# Patient Record
Sex: Male | Born: 1985 | Race: Black or African American | Hispanic: No | Marital: Single | Smoking: Current every day smoker
Health system: Southern US, Community
[De-identification: ages and names within clinical notes are randomized; demographics above are authoritative.]

## PROBLEM LIST (undated history)

## (undated) DIAGNOSIS — W3400XA Accidental discharge from unspecified firearms or gun, initial encounter: Secondary | ICD-10-CM

## (undated) DIAGNOSIS — A599 Trichomoniasis, unspecified: Secondary | ICD-10-CM

## (undated) HISTORY — PX: EYE SURGERY: SHX253

## (undated) HISTORY — PX: TONSILLECTOMY: SUR1361

## (undated) HISTORY — PX: OTHER SURGICAL HISTORY: SHX169

---

## 2013-06-29 ENCOUNTER — Emergency Department (HOSPITAL_BASED_OUTPATIENT_CLINIC_OR_DEPARTMENT_OTHER)
Admission: EM | Admit: 2013-06-29 | Discharge: 2013-06-29 | Disposition: A | Discharge status: Home / Self Care | Payer: Self-pay | Attending: Emergency Medicine | Admitting: Emergency Medicine

## 2013-06-29 ENCOUNTER — Encounter (HOSPITAL_BASED_OUTPATIENT_CLINIC_OR_DEPARTMENT_OTHER): Payer: Self-pay | Admitting: *Deleted

## 2013-06-29 DIAGNOSIS — L739 Follicular disorder, unspecified: Secondary | ICD-10-CM

## 2013-06-29 DIAGNOSIS — Z872 Personal history of diseases of the skin and subcutaneous tissue: Secondary | ICD-10-CM | POA: Insufficient documentation

## 2013-06-29 DIAGNOSIS — L738 Other specified follicular disorders: Secondary | ICD-10-CM | POA: Insufficient documentation

## 2013-06-29 MED ORDER — SULFAMETHOXAZOLE-TMP DS 800-160 MG PO TABS
1.0000 | ORAL_TABLET | Freq: Once | ORAL | Status: AC
  Administered 2013-06-29: 1 via ORAL
  Filled 2013-06-29: qty 1

## 2013-06-29 MED ORDER — SULFAMETHOXAZOLE-TRIMETHOPRIM 800-160 MG PO TABS: 1.0000 | ORAL_TABLET | Freq: Two times a day (BID) | ORAL | Status: DC

## 2013-06-29 NOTE — ED Notes (Signed)
PT reports open sores/knots to top of head x 1 week. Denies pain. Significant other reports pt's lymph nodes seemed swollen. Small red sores noted in between dreads. Pt has been scratching sores.

## 2013-06-29 NOTE — ED Provider Notes (Signed)
CSN: [id number]     Arrival date & time 06/29/13  1026 History   First MD Initiated Contact with Patient 06/29/13 1045     Chief Complaint  Patient presents with  . Abrasion   (Consider location/radiation/quality/duration/timing/severity/associated sxs/prior Treatment) HPI Patient presents with concerns of multiple cutaneous lesions.  Symptoms began approximately one week ago.  Since onset there been multiple areas on his scalp, and one on his chest.  Lesions are itchy, not painful.  There is no concurrent fevers, chills, nausea, vomiting, confusion, disorientation, chest pain, dyspnea or any other focal complaints. Patient states that he has a history of impetigo in the distant past. Patient is generally well. Patient has 8 children in his household.  History reviewed. No pertinent past medical history. Past Surgical History  Procedure Laterality Date  . Tonsillectomy    . Arm surgery     No family history on file. History  Substance Use Topics  . Smoking status: Never Smoker   . Smokeless tobacco: Not on file  . Alcohol Use: No    Review of Systems  All other systems reviewed and are negative.    Allergies  Review of patient's allergies indicates no known allergies.  Home Medications   Current Outpatient Rx  Name  Route  Sig  Dispense  Refill  . sulfamethoxazole-trimethoprim (SEPTRA DS) 800-160 MG per tablet   Oral   Take 1 tablet by mouth every 12 (twelve) hours.   10 tablet   0    BP 141/95  Pulse 81  Temp(Src) 98.4 F (36.9 C) (Oral)  Resp 20  Ht 6\' 2"  (1.88 m)  Wt 328 lb (148.78 kg)  BMI 42.09 kg/m2  SpO2 98% Physical Exam  Nursing note and vitals reviewed. Constitutional: He is oriented to person, place, and time. He appears well-developed. No distress.  HENT:  Head: Normocephalic and atraumatic.  Eyes: Conjunctivae and EOM are normal.  Pulmonary/Chest: Effort normal. No stridor. No respiratory distress.  Musculoskeletal: He exhibits no edema.   Lymphadenopathy:       Head (right side): Occipital adenopathy present.       Head (left side): Occipital adenopathy present.  Neurological: He is alert and oriented to person, place, and time.  Skin: Skin is warm and dry.  Scattered about the scalp, and with one lesion on the chest there are multiple lesions each of which is approximately 1 cm in diameter with a central pale area, mild serous exudate, trace surrounding erythema.  No discharge, no bleeding.  Psychiatric: He has a normal mood and affect.    ED Course  Procedures (including critical care time) Labs Review Labs Reviewed - No data to display Imaging Review No results found.  MDM   1. Folliculitis    This generally well male presents with new cutaneous lesions, and no fever, chills, other evidence of systemic infection.  Though this may represent a yeast infection, the distribution of lesions is more consistent with bacterial infection.  Patient started on antibiotics, and after a lengthy discussion on return precautions, follow up instructions he was discharged in stable condition.    Gerhard Munch, MD 06/29/13 1128

## 2014-04-22 ENCOUNTER — Emergency Department (HOSPITAL_BASED_OUTPATIENT_CLINIC_OR_DEPARTMENT_OTHER)
Admission: EM | Admit: 2014-04-22 | Discharge: 2014-04-22 | Disposition: A | Discharge status: Home / Self Care | Payer: Self-pay | Attending: Emergency Medicine | Admitting: Emergency Medicine

## 2014-04-22 ENCOUNTER — Encounter (HOSPITAL_BASED_OUTPATIENT_CLINIC_OR_DEPARTMENT_OTHER): Payer: Self-pay | Admitting: Emergency Medicine

## 2014-04-22 DIAGNOSIS — F17200 Nicotine dependence, unspecified, uncomplicated: Secondary | ICD-10-CM | POA: Insufficient documentation

## 2014-04-22 DIAGNOSIS — Z79899 Other long term (current) drug therapy: Secondary | ICD-10-CM | POA: Insufficient documentation

## 2014-04-22 DIAGNOSIS — Z792 Long term (current) use of antibiotics: Secondary | ICD-10-CM | POA: Insufficient documentation

## 2014-04-22 DIAGNOSIS — A64 Unspecified sexually transmitted disease: Secondary | ICD-10-CM | POA: Insufficient documentation

## 2014-04-22 HISTORY — DX: Trichomoniasis, unspecified: A59.9

## 2014-04-22 MED ORDER — METRONIDAZOLE 500 MG PO TABS: 500.0000 mg | ORAL_TABLET | Freq: Three times a day (TID) | ORAL | Status: DC

## 2014-04-22 MED ORDER — CEFTRIAXONE SODIUM 250 MG IJ SOLR
250.0000 mg | Freq: Once | INTRAMUSCULAR | Status: AC
  Administered 2014-04-22: 250 mg via INTRAMUSCULAR
  Filled 2014-04-22: qty 250

## 2014-04-22 MED ORDER — AZITHROMYCIN 1 G PO PACK
1.0000 g | PACK | Freq: Once | ORAL | Status: AC
  Administered 2014-04-22: 1 g via ORAL
  Filled 2014-04-22: qty 1

## 2014-04-22 NOTE — ED Provider Notes (Addendum)
CSN: [id number]     Arrival date & time 04/22/14  0248 History   First MD Initiated Contact with Patient 04/22/14 [phone]     Chief Complaint  Patient presents with  . Exposure to STD     (Consider location/radiation/quality/duration/timing/severity/associated sxs/prior Treatment) Patient is a 28 y.o. male presenting with STD exposure. The history is provided by the patient.  Exposure to STD This is a new problem. The current episode started yesterday (girlfriend told him yesterday she had trichomonas.  ). The problem occurs constantly. The problem has not changed since onset.Pertinent negatives include no chest pain, no abdominal pain, no headaches and no shortness of breath. Nothing aggravates the symptoms. Nothing relieves the symptoms. He has tried nothing for the symptoms. The treatment provided no relief.    Past Medical History  Diagnosis Date  . Trichimoniasis    Past Surgical History  Procedure Laterality Date  . Tonsillectomy    . Arm surgery    . Eye surgery     History reviewed. No pertinent family history. History  Substance Use Topics  . Smoking status: Current Every Day Smoker -- 1.00 packs/day    Types: Cigarettes  . Smokeless tobacco: Not on file  . Alcohol Use: 1.2 oz/week    2 Cans of beer per week     Comment: daily    Review of Systems  Respiratory: Negative for shortness of breath.   Cardiovascular: Negative for chest pain.  Gastrointestinal: Negative for abdominal pain.  Genitourinary: Negative for dysuria, discharge, penile swelling, scrotal swelling and penile pain.  Neurological: Negative for headaches.  All other systems reviewed and are negative.     Allergies  Review of patient's allergies indicates no known allergies.  Home Medications   Prior to Admission medications   Medication Sig Start Date End Date Taking? Authorizing Provider  metroNIDAZOLE (FLAGYL) 500 MG tablet Take 1 tablet (500 mg total) by mouth 3 (three) times daily. 2  grams PO x 1 04/22/14   Meaghen Vecchiarelli K Zala Degrasse-Rasch, MD  sulfamethoxazole-trimethoprim (SEPTRA DS) 800-160 MG per tablet Take 1 tablet by mouth every 12 (twelve) hours. 06/29/13   Gerhard Munchobert Lockwood, MD   BP 174/97  Pulse 91  Temp(Src) 98 F (36.7 C) (Oral)  Resp 20  Ht 6\' 2"  (1.88 m)  Wt 340 lb (154.223 kg)  BMI 43.63 kg/m2  SpO2 96% Physical Exam  Constitutional: He is oriented to person, place, and time. He appears well-developed and well-nourished.  HENT:  Head: Normocephalic and atraumatic.  Mouth/Throat: Oropharynx is clear and moist.  Eyes: Conjunctivae are normal. Pupils are equal, round, and reactive to light.  Neck: Normal range of motion. Neck supple.  Cardiovascular: Normal rate, regular rhythm and intact distal pulses.   Pulmonary/Chest: Effort normal and breath sounds normal. He has no wheezes. He has no rales.  Abdominal: Soft. Bowel sounds are normal. There is no tenderness. There is no rebound and no guarding.  Genitourinary:  Patient refused  Musculoskeletal: Normal range of motion.  Neurological: He is alert and oriented to person, place, and time.  Skin: Skin is warm and dry.  Psychiatric: He has a normal mood and affect.    ED Course  Procedures (including critical care time) Labs Review Labs Reviewed  GC/CHLAMYDIA PROBE AMP    Imaging Review No results found.   EKG Interpretation None      MDM   Final diagnoses:  STI (sexually transmitted infection)    Refused penile swab seems reasonable to treat given history.  No sex for seven days.  Given RX for flagyl because patient recently drank alcohol and do not want to cause disulfram reaction     Dakiya Puopolo K Kaiyu Mirabal-Rasch, MD 04/22/14 0810  Rachella Basden K Alsie Younes-Rasch, MD 04/22/14 [phone]

## 2014-04-22 NOTE — Discharge Instructions (Signed)
Sexually Transmitted Disease A sexually transmitted disease (STD) is a disease or infection often passed to another person during sex. However, STDs can be passed through nonsexual ways. An STD can be passed through:  Spit (saliva).  Semen.  Blood.  Mucus from the vagina.  Pee (urine). HOW CAN I LESSEN MY CHANCES OF GETTING AN STD?  Use:  Latex condoms.  Water-soluble lubricants with condoms. Do not use petroleum jelly or oils.  Dental dams. These are small pieces of latex that are used as a barrier during oral sex.  Avoid having more than one sex partner.  Do not have sex with someone who has other sex partners.  Do not have sex with anyone you do not know or who is at high risk for an STD.  Avoid risky sex that can break your skin.  Do not have sex if you have open sores on your mouth or skin.  Avoid drinking too much alcohol or taking illegal drugs. Alcohol and drugs can affect your good judgment.  Avoid oral and anal sex acts.  Get shots (vaccines) for HPV and hepatitis.  If you are at risk of being infected with HIV, it is advised that you take a certain medicine daily to prevent HIV infection. This is called pre-exposure prophylaxis (PrEP). You may be at risk if:  You are a man who has sex with other men (MSM).  You are attracted to the opposite sex (heterosexual) and are having sex with more than one partner.  You take drugs with a needle.  You have sex with someone who has HIV.  Talk with your doctor about if you are at high risk of being infected with HIV. If you begin to take PrEP, get tested for HIV first. Get tested every 3 months for as long as you are taking PrEP. WHAT SHOULD I DO IF I THINK I HAVE AN STD?  See your doctor.  Tell your sex partner(s) that you have an STD. They should be tested and treated.  Do not have sex until your doctor says it is okay. WHEN SHOULD I GET HELP? Get help right away if:  You have bad belly (abdominal)  pain.  You are a man and have puffiness (swelling) or pain in your testicles.  You are a woman and have puffiness in your vagina. Document Released: 11/18/2004 Document Revised: 10/16/2013 Document Reviewed: 04/06/2013 [employer] Patient Information 2015 [employer]. This information is not intended to replace advice given to you by your health care provider. Make sure you discuss any questions you have with your health care provider.  

## 2014-04-22 NOTE — ED Notes (Signed)
Pt reports that his sexual partner went to [facility] and was told she has trich now he wants to get tested.  Denies any penile discharge, burning with urination or sores.

## 2014-11-12 ENCOUNTER — Emergency Department (HOSPITAL_BASED_OUTPATIENT_CLINIC_OR_DEPARTMENT_OTHER)
Admission: EM | Admit: 2014-11-12 | Discharge: 2014-11-12 | Disposition: A | Discharge status: Home / Self Care | Payer: Self-pay | Attending: Emergency Medicine | Admitting: Emergency Medicine

## 2014-11-12 ENCOUNTER — Encounter (HOSPITAL_BASED_OUTPATIENT_CLINIC_OR_DEPARTMENT_OTHER): Payer: Self-pay | Admitting: *Deleted

## 2014-11-12 DIAGNOSIS — Z791 Long term (current) use of non-steroidal anti-inflammatories (NSAID): Secondary | ICD-10-CM | POA: Insufficient documentation

## 2014-11-12 DIAGNOSIS — Z792 Long term (current) use of antibiotics: Secondary | ICD-10-CM | POA: Insufficient documentation

## 2014-11-12 DIAGNOSIS — Z8619 Personal history of other infectious and parasitic diseases: Secondary | ICD-10-CM | POA: Insufficient documentation

## 2014-11-12 DIAGNOSIS — L0231 Cutaneous abscess of buttock: Secondary | ICD-10-CM | POA: Insufficient documentation

## 2014-11-12 DIAGNOSIS — L0291 Cutaneous abscess, unspecified: Secondary | ICD-10-CM

## 2014-11-12 MED ORDER — SULFAMETHOXAZOLE-TRIMETHOPRIM 800-160 MG PO TABS
1.0000 | ORAL_TABLET | Freq: Two times a day (BID) | ORAL | Status: DC
Start: 2014-11-12 — End: 2015-03-03

## 2014-11-12 MED ORDER — TRAMADOL HCL 50 MG PO TABS: 50.0000 mg | ORAL_TABLET | Freq: Four times a day (QID) | ORAL | Status: DC | PRN

## 2014-11-12 MED ORDER — MIDAZOLAM HCL 2 MG/2ML IJ SOLN
2.0000 mg | Freq: Once | INTRAMUSCULAR | Status: AC
Start: 2014-11-12 — End: 2014-11-12
  Administered 2014-11-12: 2 mg via INTRAVENOUS

## 2014-11-12 MED ORDER — FENTANYL CITRATE 0.05 MG/ML IJ SOLN
50.0000 ug | Freq: Once | INTRAMUSCULAR | Status: AC
  Administered 2014-11-12: 50 ug via INTRAVENOUS

## 2014-11-12 MED ORDER — LIDOCAINE-EPINEPHRINE (PF) 2 %-1:200000 IJ SOLN
INTRAMUSCULAR | Status: AC
  Administered 2014-11-12: 10 mL
  Filled 2014-11-12: qty 20

## 2014-11-12 MED ORDER — FENTANYL CITRATE 0.05 MG/ML IJ SOLN
INTRAMUSCULAR | Status: AC
  Filled 2014-11-12: qty 2

## 2014-11-12 MED ORDER — HYDROMORPHONE HCL 1 MG/ML IJ SOLN
0.5000 mg | Freq: Once | INTRAMUSCULAR | Status: AC
  Administered 2014-11-12: 0.5 mg via INTRAVENOUS
  Filled 2014-11-12: qty 1

## 2014-11-12 MED ORDER — NAPROXEN 500 MG PO TABS: 500.0000 mg | ORAL_TABLET | Freq: Two times a day (BID) | ORAL | Status: DC

## 2014-11-12 MED ORDER — PROPOFOL 10 MG/ML IV BOLUS
200.0000 mg | Freq: Once | INTRAVENOUS | Status: AC
  Administered 2014-11-12: 50 mg via INTRAVENOUS
  Filled 2014-11-12: qty 20

## 2014-11-12 MED ORDER — MIDAZOLAM HCL 2 MG/2ML IJ SOLN
INTRAMUSCULAR | Status: AC
  Filled 2014-11-12: qty 4

## 2014-11-12 MED ORDER — SULFAMETHOXAZOLE-TRIMETHOPRIM 800-160 MG PO TABS
1.0000 | ORAL_TABLET | Freq: Once | ORAL | Status: AC
  Administered 2014-11-12: 1 via ORAL
  Filled 2014-11-12: qty 1

## 2014-11-12 MED ORDER — PROPOFOL 10 MG/ML IV BOLUS
INTRAVENOUS | Status: AC | PRN
  Administered 2014-11-12: 50 mg via INTRAVENOUS

## 2014-11-12 NOTE — ED Notes (Signed)
No answer when called to triage.

## 2014-11-12 NOTE — Sedation Documentation (Signed)
Dr. Hyacinth MeekerMiller back in to see Pt. And speak with pt. Who is awake and conversing about his post care.  Pt. Aware of how to care for the area of concern.

## 2014-11-12 NOTE — Sedation Documentation (Signed)
Kayla Love EMT in room at 1405 during procedure with Pt. At bedside to assist with Dr. Hyacinth MeekerMiller.    Herbert DeanerKayla Love out at [phone]

## 2014-11-12 NOTE — ED Notes (Signed)
States he has a cyst between his scrotum and his buttocks. He states it has been there for years but it just opened and draining for the past few hours.

## 2014-11-12 NOTE — ED Provider Notes (Signed)
CSN: [id number]     Arrival date & time 11/12/14  1249 History   First MD Initiated Contact with Patient 11/12/14 1300     Chief Complaint  Patient presents with  . Abscess     (Consider location/radiation/quality/duration/timing/severity/associated sxs/prior Treatment) HPI Comments: The patient is a 29 year old male, he has a history of obesity, history of recurrent infections such as abscesses in the past, presents with several days of worsening pain to his perineum which is gradually worsening, worse with palpation and is now spontaneously draining purulent material. He denies fevers chills nausea vomiting or abdominal pain. The symptoms are persistent  Patient is a 29 y.o. male presenting with abscess. The history is provided by the patient.  Abscess Associated symptoms: no fever, no nausea and no vomiting     Past Medical History  Diagnosis Date  . Trichimoniasis    Past Surgical History  Procedure Laterality Date  . Tonsillectomy    . Arm surgery    . Eye surgery     No family history on file. History  Substance Use Topics  . Smoking status: Current Every Day Smoker -- 1.00 packs/day    Types: Cigarettes  . Smokeless tobacco: Not on file  . Alcohol Use: 1.2 oz/week    2 Cans of beer per week     Comment: daily    Review of Systems  Constitutional: Negative for fever and chills.  Gastrointestinal: Negative for nausea and vomiting.  Skin: Positive for rash.       abscess      Allergies  Review of patient's allergies indicates no known allergies.  Home Medications   Prior to Admission medications   Medication Sig Start Date End Date Taking? Authorizing Provider  metroNIDAZOLE (FLAGYL) 500 MG tablet Take 1 tablet (500 mg total) by mouth 3 (three) times daily. 2 grams PO x 1 04/22/14   April K Palumbo-Rasch, MD  naproxen (NAPROSYN) 500 MG tablet Take 1 tablet (500 mg total) by mouth 2 (two) times daily with a meal. 11/12/14   Vida RollerBrian D Delwin Raczkowski, MD   sulfamethoxazole-trimethoprim (SEPTRA DS) 800-160 MG per tablet Take 1 tablet by mouth every 12 (twelve) hours. 11/12/14   Vida RollerBrian D Tattiana Fakhouri, MD  traMADol (ULTRAM) 50 MG tablet Take 1 tablet (50 mg total) by mouth every 6 (six) hours as needed. 11/12/14   Vida RollerBrian D Trypp Heckmann, MD   BP 121/73 mmHg  Pulse 100  Temp(Src) 98.8 F (37.1 C) (Oral)  Resp 20  Ht 6\' 2"  (1.88 m)  Wt 345 lb (156.491 kg)  BMI 44.28 kg/m2  SpO2 99% Physical Exam  Constitutional: He appears well-developed and well-nourished. No distress.  HENT:  Head: Normocephalic and atraumatic.  Eyes: Conjunctivae are normal. Right eye exhibits no discharge. Left eye exhibits no discharge. No scleral icterus.  Cardiovascular: Normal rate and regular rhythm.   No murmur heard. Pulmonary/Chest: Effort normal and breath sounds normal.  Genitourinary:  Perineum has a abscess mostly on the left inner buttock and perianal area, spontaneously draining purulent foul-smelling bloody material  Musculoskeletal: He exhibits tenderness. He exhibits no edema.  Skin: Skin is warm and dry. He is not diaphoretic.  Nursing note and vitals reviewed.   ED Course  Procedures (including critical care time) Labs Review Labs Reviewed - No data to display  Imaging Review No results found.     MDM   Final diagnoses:  Abscess    Abscess present, we'll need to investigate further with bedside exam, bedside ultrasound, pain medications  ordered. The location and size of this infection suggest the need for antibiotics, it is draining spontaneously but may need additional incision for complete spontaneous drainage.  Procedural sedation Performed by: Vida Roller Consent: Verbal consent obtained. Risks and benefits: risks, benefits and alternatives were discussed Required items: required blood products, implants, devices, and special equipment available Patient identity confirmed: arm band and provided demographic data Time out: Immediately prior  to procedure a "time out" was called to verify the correct patient, procedure, equipment, support staff and site/side marked as required.  Sedation type: moderate (conscious) sedation NPO time confirmed and considedered  Sedatives: PROPOFOL, Versed, Fentanyl  Physician Time at Bedside: 20 minutes   Vitals: Vital signs were monitored during sedation. Cardiac Monitor, pulse oximeter Patient tolerance: Patient tolerated the procedure well with no immediate complications. Comments: Pt with uneventful recovered. Returned to pre-procedural sedation baseline  INCISION AND DRAINAGE Performed by: Eber Hong D Consent: Verbal consent obtained. Risks and benefits: risks, benefits and alternatives were discussed Type: abscess  Body area: L perineum  Anesthesia: local infiltration  Incision was made with a scalpel.  Local anesthetic: lidocaine 2% with epinephrine  Anesthetic total: 3 ml  Complexity: complex Blunt dissection to break up loculations  Drainage: purulent  Drainage amount: moderate   Packing material:  Patient tolerance: Patient tolerated the procedure well with no immediate complications. There was already a large draining sinus = Materials inside of it. I had to probe around with my finger, I inserted my finger into the abscess cavity to break up loculations, I was able to completely clean out and then irrigate with saline until clean. The opening of the sinus was too large to hold packing, there was no other abscess seen on bedside ultrasound by myself, clinically there is some surrounding cellulitis which will require antibiotics however the patient appears stable and amenable to discharge.  Meds given in ED:  Medications  lidocaine-EPINEPHrine (XYLOCAINE W/EPI) 2 %-1:200000 (PF) injection (not administered)  HYDROmorphone (DILAUDID) injection 0.5 mg (0.5 mg Intravenous Given 11/12/14 1341)  propofol (DIPRIVAN) 10 mg/mL bolus/IV push 200 mg (0 mg Intravenous Stopped  11/12/14 1437)  midazolam (VERSED) injection 2 mg (2 mg Intravenous Given 11/12/14 1428)  fentaNYL (SUBLIMAZE) injection 50 mcg (50 mcg Intravenous Given 11/12/14 1427)    New Prescriptions   NAPROXEN (NAPROSYN) 500 MG TABLET    Take 1 tablet (500 mg total) by mouth 2 (two) times daily with a meal.   SULFAMETHOXAZOLE-TRIMETHOPRIM (SEPTRA DS) 800-160 MG PER TABLET    Take 1 tablet by mouth every 12 (twelve) hours.   TRAMADOL (ULTRAM) 50 MG TABLET    Take 1 tablet (50 mg total) by mouth every 6 (six) hours as needed.    was    Vida Roller, MD 11/12/14 [phone]

## 2014-11-12 NOTE — Sedation Documentation (Signed)
Pt. Still feeling pain after meds given  Pt. Has had 100mg  of diprivanas of 1420

## 2014-11-12 NOTE — Discharge Instructions (Signed)

## 2014-11-14 ENCOUNTER — Emergency Department (HOSPITAL_BASED_OUTPATIENT_CLINIC_OR_DEPARTMENT_OTHER)
Admission: EM | Admit: 2014-11-14 | Discharge: 2014-11-14 | Disposition: A | Discharge status: Home / Self Care | Payer: Self-pay | Attending: Emergency Medicine | Admitting: Emergency Medicine

## 2014-11-14 ENCOUNTER — Encounter (HOSPITAL_BASED_OUTPATIENT_CLINIC_OR_DEPARTMENT_OTHER): Payer: Self-pay | Admitting: *Deleted

## 2014-11-14 DIAGNOSIS — Z8619 Personal history of other infectious and parasitic diseases: Secondary | ICD-10-CM | POA: Insufficient documentation

## 2014-11-14 DIAGNOSIS — Z79899 Other long term (current) drug therapy: Secondary | ICD-10-CM | POA: Insufficient documentation

## 2014-11-14 DIAGNOSIS — Z720 Tobacco use: Secondary | ICD-10-CM | POA: Insufficient documentation

## 2014-11-14 DIAGNOSIS — Z5189 Encounter for other specified aftercare: Secondary | ICD-10-CM

## 2014-11-14 DIAGNOSIS — Z791 Long term (current) use of non-steroidal anti-inflammatories (NSAID): Secondary | ICD-10-CM | POA: Insufficient documentation

## 2014-11-14 DIAGNOSIS — Z4801 Encounter for change or removal of surgical wound dressing: Secondary | ICD-10-CM | POA: Insufficient documentation

## 2014-11-14 NOTE — Discharge Instructions (Signed)
Return to the emergency room with worsening of symptoms, new symptoms or with symptoms that are concerning, especially fevers, increased redness, swelling, pain, red streaks, or pus from the area, generally feeling ill. Please take all of your antibiotics until finished!   You may develop abdominal discomfort or diarrhea from the antibiotic.  You may help offset this with probiotics which you can buy or get in yogurt. Do not eat  or take the probiotics until 2 hours after your antibiotic.  Use below resources to establish care with primary care provider  Emergency Department Resource Guide 1) Find a Doctor and Pay Out of Pocket Although you won't have to find out who is covered by your insurance plan, it is a good idea to ask around and get recommendations. You will then need to call the office and see if the doctor you have chosen will accept you as a new patient and what types of options they offer for patients who are self-pay. Some doctors offer discounts or will set up payment plans for their patients who do not have insurance, but you will need to ask so you aren't surprised when you get to your appointment.  2) Contact Your Local Health Department Not all health departments have doctors that can see patients for sick visits, but many do, so it is worth a call to see if yours does. If you don't know where your local health department is, you can check in your phone book. The CDC also has a tool to help you locate your state's health department, and many state websites also have listings of all of their local health departments.  3) Find a Walk-in Clinic If your illness is not likely to be very severe or complicated, you may want to try a walk in clinic. These are popping up all over the country in pharmacies, drugstores, and shopping centers. They're usually staffed by nurse practitioners or physician assistants that have been trained to treat common illnesses and complaints. They're usually fairly  quick and inexpensive. However, if you have serious medical issues or chronic medical problems, these are probably not your best option.  No Primary Care Doctor: - Call Health Connect at  [phone] - they can help you locate a primary care doctor that  accepts your insurance, provides certain services, etc. - Physician Referral Service- [phone]  Chronic Pain Problems: Organization         Address  Phone   Notes  [facility]  [phone] Patients need to be referred by their primary care doctor.   Medication Assistance: Organization         Address  Phone   Notes  [facility] [address] [place], [place] [address] [phone] --Must be a resident of [facility] [place] -- Must have NO insurance coverage whatsoever (no Medicaid/ Medicare, etc.) -- The pt. MUST have a primary care doctor that directs their care regularly and follows them in the community   MedAssist  [phone]) [phone]   [employer]  [phone]    Agencies that provide inexpensive medical care: Organization         Address  Phone   Notes  [facility]  [phone]   [facility]    [phone]) [phone]   [facility] [address] [place], [place] [address] [phone]   [facility] [address], [place] [phone]  Planned Parenthood    [phone]   [facility]    [phone]   [facility] [address], [place] Phone:  [phone], Fax:  [phone] Hours of Operation:  9 am - 6 pm, M-F.  Also accepts Medicaid/Medicare and self-pay.  [facility] [place], [address], [place] Phone: [phone], Fax: [phone]. Hours of Operation:  8:30 am - 5:30 pm, M-F.  Also accepts Medicaid and self-pay.  [facility] [address] [place] Phone: [phone]    [place], [place], [place] [phone], Ext. [phone] Mondays & Thursdays: 7-9 AM.  First 15 patients are seen on a first come, first serve basis.    [place] Providers:  Organization         Address  Phone   Notes  [facility] [address], [place] [phone] Also accepts self-pay patients.  [facility] [address], [place]  [phone]   [place], [address], [place] [phone]   [facility] [address] [place] [phone]   Lucianne Lei [address], [place]   [phone] Only accepts [place] Access [place] patients after they have their name applied to their card.   Self-Pay (no insurance) in [facility]:  Organization         Address  Phone   Notes  Sickle Cell Patients, [facility] [place] [phone]   [facility] [place] [phone]   [facility] [place]  [place], [place], [place] [phone]   [facility]/Dr. Osei-Bonsu  [address], [place] or [address], [place] [phone] Phone number for both [place] and [place] locations is the same.  [facility] [address] [place] [phone]   [facility] [address] [place] or [address] [phone]   [facility] [address] [place] [phone], phone; [phone], fax Sees patients 1st and 3rd Saturday of every month.  Must not qualify for public or private insurance (i.e. Medicaid, Medicare, [place] Health Choice, Veterans' Benefits)  Household income should be no more than 200% of the poverty level The clinic cannot treat you if you are  pregnant or think you are pregnant  Sexually transmitted diseases are not treated at the clinic.    Dental Care: Organization         Address  Phone  Notes  [facility] Department of [facility] [place] [phone] Accepts children up to age 53 who are enrolled in [place] or [place]; pregnant women with a Medicaid card; and children who have applied for Medicaid or [place] Health Choice, but were declined, whose parents can pay a reduced fee at time of service.  [facility]  [address], [place] [phone] Accepts children up to age 22 who are enrolled in [place] or [place]; pregnant women with a Medicaid card; and children who have applied for Medicaid or [place], but were declined,  whose parents can pay a reduced fee at time of service.  [facility] Adult [facility] Access PROGRAM  [place] [phone] Patients are seen by appointment only. Walk-ins are not accepted. [place] will see patients 63 years of age and older. Monday - Tuesday (8am-5pm) Most Wednesdays (8:30-5pm) $30 per visit, cash only  [facility] Access PRO[facility]  [address], [facility] [phone] Patients are seen by appointment only. Walk-ins are not accepted. [place] will see patients 27 years of age and older. One Wednesday Evening (Monthly: Volunteer Based).  $30 per visit, cash only  [place]  [phone] for adults; Children under age 65, call Graduate Pediatric Dentistry at [phone]. Children aged 65-14, please call [phone] to request a pediatric application.  Dental services are provided in all areas of dental care including fillings, crowns and bridges, complete and partial dentures, implants, gum treatment, root canals, and extractions. Preventive care is also provided. Treatment is provided to  both adults and children. Patients are selected via a lottery and there is often a waiting list.   [facility] [address], [place]  [phone] [email or web address]   [facility] [address] [place], [place] [phone], Ext. [phone] Second and Fourth Thursday of each month, opens at 6:30 AM; Clinic ends at 9 AM.  Patients are seen on a first-come first-served basis, and a limited number are seen during each clinic.   [facility]  [address] Hillard Danker [place], [place] [phone]   Eligibility Requirements You must have lived in [place], [place], or [place] counties for at least the last three months.   You cannot be eligible for state or federal sponsored [employer], including [employer], [place], or [employer].   You generally cannot be eligible for healthcare insurance through your employer.    How to apply: Eligibility screenings are held every Tuesday and Wednesday afternoon from 1:00 pm until 4:00 pm. You do not need an appointment for the interview!  [facility] [address], [place], [place]   [place]  [place] Department  [place]  [phone]    Behavioral Health Resources in the Community: Intensive Outpatient Programs Organization         Address  Phone  Notes  [place]. [address], [place], [place] [phone]   [facility] [address], [place], [place]   ADS: Alcohol & Drug Svcs [address] [place], [place]   [place] [address]  [place], [place] or [phone]   Substance Abuse Resources Organization         Address  Phone  Notes  Alcohol and Drug Services  [phone]   [place]  [phone]   The [place]   Chinita Pester  [phone]   Residential & Outpatient Substance Abuse Program  [phone]   Psychological Services Organization         Address  Phone  Notes  [facility] [place]  [place]  [phone]   [place] [address] [place] or [phone]    Mobile Crisis Teams Organization         Address  Phone  Notes  [facility]atives, Mobile  Crisis Care Unit  [phone]   [facility] Psychotherapeutic Services  [address] [place], [place]   [facility] [address] [place] [phone]    Self-Help/Support Groups Organization         Address  Phone             Notes  [place]. of [place] - variety of support groups  [place] Call for more information  Narcotics Anonymous (NA), [facility] [address], [employer] [place]  2 meetings at this location   Special educational needs teacher         Address  Phone  Notes  [facility] Residential Treatment [place],    [place]  [phone]   [facility]  [address], [place] [address], [place], [place]   [place], [place] [phone] Admissions: 8am-3pm M-F  Incentives Substance [place] [address],    [place], [place] [phone]   [facility] [address] [place], [place], [place]   [facility] [address],  [place], [place]   Insight Programs - Intensive Outpatient [address], [place], [place]   [facility] ([place].,  [place], [place] [phone] or [phone]   [facility] Services (RTS) [address], [place], [place] Accepts Medicaid  [facility] [place] [address]  [place] [phone] Substance Abuse/Addiction Treatment   [facility] Organization         Address  Phone  Notes  [facility]  [phone]   Domenic Schwab, PhD [address] Arlis Porta [place], [place]   [phone] or [phone]   [place], [place] [phone]   [facility] [address], [place], [place] [phone] Insurance/Medicaid/sponsorship through [facility] and Families [address] [place]                                    [place], [place] [phone] [place] [address], [place] [phone]    Dr. Adele Schilder  [phone]   [facility] Dept. 1) [address] [place] 2) [place] 3)  [place] [address], [place] [phone]  [phone]   [place] [phone] or [phone] (After Hours)

## 2014-11-14 NOTE — ED Notes (Signed)
Pt reports open perineal area continues to drain bloody fluid and he is changing a dressing 1-2 times daily.  States pain is improving and denies fever or worsening condition.

## 2014-11-14 NOTE — ED Provider Notes (Signed)
CSN: [id number]     Arrival date & time 11/14/14  1135 History   First MD Initiated Contact with Patient 11/14/14 1206     Chief Complaint  Patient presents with  . Wound Check     (Consider location/radiation/quality/duration/timing/severity/associated sxs/prior Treatment) HPI  Eric House is a 29 y.o. male presenting 2 days after I and D of left groin abscess. Patient states the area continues to drain a clear thin bloody fluid but denies any pus. He states he is changing the dressing 2-3 times a day. He said his pain is improving with NSAIDs. He reports compliance with his Bactrim and denies any side effects. He denies any fevers, chills, nausea, vomiting. He denies increased redness, swelling, pain from the area. Patient denies history of diabetes, HIV.                Past Medical History  Diagnosis Date  . Trichimoniasis    Past Surgical History  Procedure Laterality Date  . Tonsillectomy    . Arm surgery    . Eye surgery     No family history on file. History  Substance Use Topics  . Smoking status: Current Every Day Smoker -- 1.00 packs/day    Types: Cigarettes  . Smokeless tobacco: Never Used  . Alcohol Use: 1.2 oz/week    2 Cans of beer per week     Comment: daily    Review of Systems  Constitutional: Negative for fever and chills.  Gastrointestinal: Negative for nausea and vomiting.  Skin: Positive for wound. Negative for pallor.      Allergies  Review of patient's allergies indicates no known allergies.  Home Medications   Prior to Admission medications   Medication Sig Start Date End Date Taking? Authorizing Provider  naproxen (NAPROSYN) 500 MG tablet Take 1 tablet (500 mg total) by mouth 2 (two) times daily with a meal. 11/12/14  Yes Vida Roller, MD  sulfamethoxazole-trimethoprim (SEPTRA DS) 800-160 MG per tablet Take 1 tablet by mouth every 12 (twelve) hours. 11/12/14  Yes Vida Roller, MD  traMADol (ULTRAM) 50 MG tablet Take 1 tablet (50 mg total)  by mouth every 6 (six) hours as needed. 11/12/14  Yes Vida Roller, MD  metroNIDAZOLE (FLAGYL) 500 MG tablet Take 1 tablet (500 mg total) by mouth 3 (three) times daily. 2 grams PO x 1 04/22/14   April K Palumbo-Rasch, MD   BP 120/71 mmHg  Pulse 92  Temp(Src) 98.7 F (37.1 C)  Resp 18  Ht  (1.88 m)  Wt 345 lb (156.491 kg)  BMI 44.28 kg/m2  SpO2 96% Physical Exam  Constitutional: He appears well-developed and well-nourished. No distress.  HENT:  Head: Normocephalic and atraumatic.  Eyes: Conjunctivae and EOM are normal. Right eye exhibits no discharge. Left eye exhibits no discharge.  Cardiovascular: Normal rate, regular rhythm and normal heart sounds.   Pulmonary/Chest: Effort normal and breath sounds normal. No respiratory distress. He has no wheezes.  Abdominal: Soft. Bowel sounds are normal. He exhibits no distension. There is no tenderness.  Genitourinary:  Patient with open lesion to left inner buttocks. Serosanguineous drainage. No pus. No redness, induration, swelling or red streaks to the area. No crepitus, lesions to the remainder of perineum, scrotum, penis.  Neurological: He is alert. He exhibits normal muscle tone. Coordination normal.  Skin: Skin is warm and dry. He is not diaphoretic.  Nursing note and vitals reviewed.   ED Course  Procedures (including critical care time) Labs  Review Labs Reviewed - No data to display  Imaging Review No results found.   EKG Interpretation None      MDM   Final diagnoses:  Encounter for wound re-check    patient presenting for wound recheck of abscess that was I and D 2 days ago. He denies history of diabetes, fevers, chills. Patient states he has had decreased pain and only serosanguineous drainage. Significant other in the room says the lesion looks much better. Lesion appears to be healing well. Patient reports compliance with Bactrim. Stressed the importance of completing the full course of it. Discussed return  precautions. Patient also given list of ED resources to establish care with primary care provider.     Louann SjogrenVictoria L Eyal Greenhaw, PA-C 11/14/14 1230  Rolan BuccoMelanie Belfi, MD 11/14/14 1319

## 2014-11-14 NOTE — ED Notes (Signed)
Abscess to groin I&D on Tuesday- here for wound recheck

## 2015-03-03 ENCOUNTER — Encounter (HOSPITAL_BASED_OUTPATIENT_CLINIC_OR_DEPARTMENT_OTHER): Payer: Self-pay

## 2015-03-03 ENCOUNTER — Emergency Department (HOSPITAL_BASED_OUTPATIENT_CLINIC_OR_DEPARTMENT_OTHER)
Admission: EM | Admit: 2015-03-03 | Discharge: 2015-03-03 | Disposition: A | Discharge status: Home / Self Care | Payer: Self-pay | Attending: Emergency Medicine | Admitting: Emergency Medicine

## 2015-03-03 DIAGNOSIS — Z720 Tobacco use: Secondary | ICD-10-CM | POA: Insufficient documentation

## 2015-03-03 DIAGNOSIS — Z792 Long term (current) use of antibiotics: Secondary | ICD-10-CM | POA: Insufficient documentation

## 2015-03-03 DIAGNOSIS — L03315 Cellulitis of perineum: Secondary | ICD-10-CM | POA: Insufficient documentation

## 2015-03-03 DIAGNOSIS — L0390 Cellulitis, unspecified: Secondary | ICD-10-CM

## 2015-03-03 DIAGNOSIS — Z8619 Personal history of other infectious and parasitic diseases: Secondary | ICD-10-CM | POA: Insufficient documentation

## 2015-03-03 DIAGNOSIS — L0291 Cutaneous abscess, unspecified: Secondary | ICD-10-CM

## 2015-03-03 DIAGNOSIS — Z791 Long term (current) use of non-steroidal anti-inflammatories (NSAID): Secondary | ICD-10-CM | POA: Insufficient documentation

## 2015-03-03 DIAGNOSIS — L02215 Cutaneous abscess of perineum: Secondary | ICD-10-CM | POA: Insufficient documentation

## 2015-03-03 MED ORDER — HYDROCODONE-ACETAMINOPHEN 5-325 MG PO TABS: 1.0000 | ORAL_TABLET | ORAL | Status: DC | PRN

## 2015-03-03 MED ORDER — IBUPROFEN 800 MG PO TABS
800.0000 mg | ORAL_TABLET | Freq: Once | ORAL | Status: AC
  Administered 2015-03-03: 800 mg via ORAL
  Filled 2015-03-03: qty 1

## 2015-03-03 MED ORDER — SULFAMETHOXAZOLE-TRIMETHOPRIM 800-160 MG PO TABS
1.0000 | ORAL_TABLET | Freq: Once | ORAL | Status: AC
  Administered 2015-03-03: 1 via ORAL
  Filled 2015-03-03: qty 1

## 2015-03-03 MED ORDER — IBUPROFEN 800 MG PO TABS: 800.0000 mg | ORAL_TABLET | Freq: Three times a day (TID) | ORAL | Status: DC | PRN

## 2015-03-03 MED ORDER — HYDROCODONE-ACETAMINOPHEN 5-325 MG PO TABS
2.0000 | ORAL_TABLET | Freq: Once | ORAL | Status: AC
  Administered 2015-03-03: 2 via ORAL
  Filled 2015-03-03: qty 2

## 2015-03-03 MED ORDER — SULFAMETHOXAZOLE-TRIMETHOPRIM 800-160 MG PO TABS
1.0000 | ORAL_TABLET | Freq: Two times a day (BID) | ORAL | Status: AC
Start: 2015-03-03 — End: 2015-03-10

## 2015-03-03 NOTE — ED Notes (Signed)
Reports abscess beneath testicles. Sts "it still came back." Reports drainage. Sts "i need pain medication"

## 2015-03-03 NOTE — Discharge Instructions (Signed)
Abscess °An abscess is an infected area that contains a collection of pus and debris. It can occur in almost any part of the body. An abscess is also known as a furuncle or boil. °CAUSES  °An abscess occurs when tissue gets infected. This can occur from blockage of oil or sweat glands, infection of hair follicles, or a minor injury to the skin. As the body tries to fight the infection, pus collects in the area and creates pressure under the skin. This pressure causes pain. People with weakened immune systems have difficulty fighting infections and get certain abscesses more often.  °SYMPTOMS °Usually an abscess develops on the skin and becomes a painful mass that is red, warm, and tender. If the abscess forms under the skin, you may feel a moveable soft area under the skin. Some abscesses break open (rupture) on their own, but most will continue to get worse without care. The infection can spread deeper into the body and eventually into the bloodstream, causing you to feel ill.  °DIAGNOSIS  °Your caregiver will take your medical history and perform a physical exam. A sample of fluid may also be taken from the abscess to determine what is causing your infection. °TREATMENT  °Your caregiver may prescribe antibiotic medicines to fight the infection. However, taking antibiotics alone usually does not cure an abscess. Your caregiver may need to make a small cut (incision) in the abscess to drain the pus. In some cases, gauze is packed into the abscess to reduce pain and to continue draining the area. °HOME CARE INSTRUCTIONS  °· Only take over-the-counter or prescription medicines for pain, discomfort, or fever as directed by your caregiver. °· If you were prescribed antibiotics, take them as directed. Finish them even if you start to feel better. °· If gauze is used, follow your caregiver's directions for changing the gauze. °· To avoid spreading the infection: °· Keep your draining abscess covered with a  bandage. °· Wash your hands well. °· Do not share personal care items, towels, or whirlpools with others. °· Avoid skin contact with others. °· Keep your skin and clothes clean around the abscess. °· Keep all follow-up appointments as directed by your caregiver. °SEEK MEDICAL CARE IF:  °· You have increased pain, swelling, redness, fluid drainage, or bleeding. °· You have muscle aches, chills, or a general ill feeling. °· You have a fever. °MAKE SURE YOU:  °· Understand these instructions. °· Will watch your condition. °· Will get help right away if you are not doing well or get worse. °Document Released: 07/21/2005 Document Revised: 04/11/2012 Document Reviewed: 12/24/2011 °[employer]® Patient Information ©2015 [employer]. This information is not intended to replace advice given to you by your health care provider. Make sure you discuss any questions you have with your health care provider. ° °Abscess °Care After °An abscess (also called a boil or furuncle) is an infected area that contains a collection of pus. Signs and symptoms of an abscess include pain, tenderness, redness, or hardness, or you may feel a moveable soft area under your skin. An abscess can occur anywhere in the body. The infection may spread to surrounding tissues causing cellulitis. A cut (incision) by the surgeon was made over your abscess and the pus was drained out. Gauze may have been packed into the space to provide a drain that will allow the cavity to heal from the inside outwards. The boil may be painful for 5 to 7 days. Most people with a boil do not have   high fevers. Your abscess, if seen early, may not have localized, and may not have been lanced. If not, another appointment may be required for this if it does not get better on its own or with medications. °HOME CARE INSTRUCTIONS  °· Only take over-the-counter or prescription medicines for pain, discomfort, or fever as directed by your caregiver. °· When you bathe, soak and then  remove gauze or iodoform packs at least daily or as directed by your caregiver. You may then wash the wound gently with mild soapy water. Repack with gauze or do as your caregiver directs. °SEEK IMMEDIATE MEDICAL CARE IF:  °· You develop increased pain, swelling, redness, drainage, or bleeding in the wound site. °· You develop signs of generalized infection including muscle aches, chills, fever, or a general ill feeling. °· An oral temperature above 102° F (38.9° C) develops, not controlled by medication. °See your caregiver for a recheck if you develop any of the symptoms described above. If medications (antibiotics) were prescribed, take them as directed. °Document Released: 04/29/2005 Document Revised: 01/03/2012 Document Reviewed: 12/25/2007 °[employer]® Patient Information ©2015 [employer]. This information is not intended to replace advice given to you by your health care provider. Make sure you discuss any questions you have with your health care provider. ° °Cellulitis °Cellulitis is an infection of the skin and the tissue beneath it. The infected area is usually red and tender. Cellulitis occurs most often in the arms and lower legs.  °CAUSES  °Cellulitis is caused by bacteria that enter the skin through cracks or cuts in the skin. The most common types of bacteria that cause cellulitis are staphylococci and streptococci. °SIGNS AND SYMPTOMS  °· Redness and warmth. °· Swelling. °· Tenderness or pain. °· Fever. °DIAGNOSIS  °Your health care provider can usually determine what is wrong based on a physical exam. Blood tests may also be done. °TREATMENT  °Treatment usually involves taking an antibiotic medicine. °HOME CARE INSTRUCTIONS  °· Take your antibiotic medicine as directed by your health care provider. Finish the antibiotic even if you start to feel better. °· Keep the infected arm or leg elevated to reduce swelling. °· Apply a warm cloth to the affected area up to 4 times per day to relieve  pain. °· Take medicines only as directed by your health care provider. °· Keep all follow-up visits as directed by your health care provider. °SEEK MEDICAL CARE IF:  °· You notice red streaks coming from the infected area. °· Your red area gets larger or turns dark in color. °· Your bone or joint underneath the infected area becomes painful after the skin has healed. °· Your infection returns in the same area or another area. °· You notice a swollen bump in the infected area. °· You develop new symptoms. °· You have a fever. °SEEK IMMEDIATE MEDICAL CARE IF:  °· You feel very sleepy. °· You develop vomiting or diarrhea. °· You have a general ill feeling (malaise) with muscle aches and pains. °MAKE SURE YOU:  °· Understand these instructions. °· Will watch your condition. °· Will get help right away if you are not doing well or get worse. °Document Released: 07/21/2005 Document Revised: 02/25/2014 Document Reviewed: 12/27/2011 °[employer]® Patient Information ©2015 [employer]. This information is not intended to replace advice given to you by your health care provider. Make sure you discuss any questions you have with your health care provider. ° °

## 2015-03-03 NOTE — ED Provider Notes (Signed)
TIME SEEN: 1:45 PM  CHIEF COMPLAINT: Perineal abscess  HPI: Pt is a 29 y.o. obese male with prior history of perineal abscess who presents to the emergency department with a perineal abscess for the past several days. Reports yesterday it opened up and began draining foul-smelling, purulent drainage. He denies any fever. No abdominal pain. No vomiting or diarrhea. No dysuria or hematuria. No testicular pain or swelling. Has had a similar abscess in January. He is not a diabetic.  ROS: See HPI Constitutional: no fever  Eyes: no drainage  ENT: no runny nose   Cardiovascular:  no chest pain  Resp: no SOB  GI: no vomiting GU: no dysuria Integumentary: no rash  Allergy: no hives  Musculoskeletal: no leg swelling  Neurological: no slurred speech ROS otherwise negative  PAST MEDICAL HISTORY/PAST SURGICAL HISTORY:  Past Medical History  Diagnosis Date  . Trichimoniasis     MEDICATIONS:  Prior to Admission medications   Medication Sig Start Date End Date Taking? Authorizing Provider  metroNIDAZOLE (FLAGYL) 500 MG tablet Take 1 tablet (500 mg total) by mouth 3 (three) times daily. 2 grams PO x 1 04/22/14   April Palumbo, MD  naproxen (NAPROSYN) 500 MG tablet Take 1 tablet (500 mg total) by mouth 2 (two) times daily with a meal. 11/12/14   Eber HongBrian Miller, MD  sulfamethoxazole-trimethoprim (SEPTRA DS) 800-160 MG per tablet Take 1 tablet by mouth every 12 (twelve) hours. 11/12/14   Eber HongBrian Miller, MD  traMADol (ULTRAM) 50 MG tablet Take 1 tablet (50 mg total) by mouth every 6 (six) hours as needed. 11/12/14   Eber HongBrian Miller, MD    ALLERGIES:  No Known Allergies  SOCIAL HISTORY:  History  Substance Use Topics  . Smoking status: Current Every Day Smoker -- 1.00 packs/day    Types: Cigarettes  . Smokeless tobacco: Never Used  . Alcohol Use: 1.2 oz/week    2 Cans of beer per week     Comment: daily    FAMILY HISTORY: No family history on file.  EXAM: BP 145/86 mmHg  Pulse 98  Temp(Src)  98.1 F (36.7 C) (Oral)  Resp 14  Ht 6\' 2"  (1.88 m)  Wt 345 lb (156.491 kg)  BMI 44.28 kg/m2  SpO2 100% CONSTITUTIONAL: Alert and oriented and responds appropriately to questions. Well-appearing; well-nourished, obese, nontoxic, pleasant HEAD: Normocephalic EYES: Conjunctivae clear, PERRL ENT: normal nose; no rhinorrhea; moist mucous membranes; pharynx without lesions noted NECK: Supple, no meningismus, no LAD  CARD: RRR; S1 and S2 appreciated; no murmurs, no clicks, no rubs, no gallops RESP: Normal chest excursion without splinting or tachypnea; breath sounds clear and equal bilaterally; no wheezes, no rhonchi, no rales, no hypoxia or respiratory distress, speaking full sentences ABD/GI: Normal bowel sounds; non-distended; soft, non-tender, no rebound, no guarding, no peritoneal signs GU:  Patient has a 2 x 3 cm erythematous indurated area to the peroneal without fluctuance and a 2 cm open area central to this indurated area that has some active purulent drainage, no crepitus, no testicular pain or swelling or scrotal masses BACK:  The back appears normal and is non-tender to palpation, there is no CVA tenderness EXT: Normal ROM in all joints; non-tender to palpation; no edema; normal capillary refill; no cyanosis, no calf tenderness or swelling    SKIN: Normal color for age and race; warm NEURO: Moves all extremities equally, sensation to light touch intact diffusely, cranial nerves II through XII intact PSYCH: The patient's mood and manner are appropriate. Grooming and  personal hygiene are appropriate.  MEDICAL DECISION MAKING: Patient here with perineal abscess of his artery open and draining. He does have some sign of cellulitis but no sign of Fournier's gangrene. He is nontoxic, afebrile, well-hydrated, well-appearing. Will discharge on Bactrim and with hydrocodone and ibuprofen for pain. Discussed warm compresses and soaking in warm baths several times a day. Discussed strict return  precautions. He verbalizes understanding and is comfortable with plan. Given outpatient PCP follow-up information.      Layla MawKristen N Rebel Laughridge, DO 03/03/15 1415

## 2015-09-02 ENCOUNTER — Emergency Department (HOSPITAL_BASED_OUTPATIENT_CLINIC_OR_DEPARTMENT_OTHER)
Admission: EM | Admit: 2015-09-02 | Discharge: 2015-09-02 | Disposition: A | Discharge status: Home / Self Care | Payer: Self-pay | Attending: Emergency Medicine | Admitting: Emergency Medicine

## 2015-09-02 ENCOUNTER — Encounter (HOSPITAL_BASED_OUTPATIENT_CLINIC_OR_DEPARTMENT_OTHER): Payer: Self-pay | Admitting: *Deleted

## 2015-09-02 DIAGNOSIS — J029 Acute pharyngitis, unspecified: Secondary | ICD-10-CM | POA: Insufficient documentation

## 2015-09-02 DIAGNOSIS — R519 Headache, unspecified: Secondary | ICD-10-CM

## 2015-09-02 DIAGNOSIS — Z720 Tobacco use: Secondary | ICD-10-CM | POA: Insufficient documentation

## 2015-09-02 DIAGNOSIS — Z8619 Personal history of other infectious and parasitic diseases: Secondary | ICD-10-CM | POA: Insufficient documentation

## 2015-09-02 DIAGNOSIS — Z87828 Personal history of other (healed) physical injury and trauma: Secondary | ICD-10-CM | POA: Insufficient documentation

## 2015-09-02 DIAGNOSIS — R51 Headache: Secondary | ICD-10-CM | POA: Insufficient documentation

## 2015-09-02 HISTORY — DX: Accidental discharge from unspecified firearms or gun, initial encounter: W34.00XA

## 2015-09-02 LAB — BASIC METABOLIC PANEL
ANION GAP: 6 (ref 5–15)
BUN: 11 mg/dL (ref 6–20)
CO2: 28 mmol/L (ref 22–32)
Calcium: 8.6 mg/dL — ABNORMAL LOW (ref 8.9–10.3)
Chloride: 105 mmol/L (ref 101–111)
Creatinine, Ser: 0.95 mg/dL (ref 0.61–1.24)
GFR calc Af Amer: >60 mL/min (ref >60)
Glucose, Bld: 135 mg/dL — ABNORMAL HIGH (ref 65–99)
POTASSIUM: 3.7 mmol/L (ref 3.5–5.1)
SODIUM: 139 mmol/L (ref 135–145)

## 2015-09-02 LAB — CBC WITH DIFFERENTIAL/PLATELET
BASOS ABS: 0 10*3/uL (ref 0.0–0.1)
BASOS PCT: 0 %
Eosinophils Absolute: 0.1 10*3/uL (ref 0.0–0.7)
Eosinophils Relative: 2 %
HEMATOCRIT: 45.6 % (ref 39.0–52.0)
HEMOGLOBIN: 15.1 g/dL (ref 13.0–17.0)
Lymphocytes Relative: 35 %
Lymphs Abs: 2.1 10*3/uL (ref 0.7–4.0)
MCH: 28.4 pg (ref 26.0–34.0)
MCHC: 33.1 g/dL (ref 30.0–36.0)
MCV: 85.9 fL (ref 78.0–100.0)
Monocytes Absolute: 0.6 10*3/uL (ref 0.1–1.0)
Monocytes Relative: 10 %
NEUTROS ABS: 3.3 10*3/uL (ref 1.7–7.7)
NEUTROS PCT: 53 %
Platelets: 229 10*3/uL (ref 150–400)
RBC: 5.31 MIL/uL (ref 4.22–5.81)
RDW: 14.7 % (ref 11.5–15.5)
WBC: 6.1 10*3/uL (ref 4.0–10.5)

## 2015-09-02 LAB — URINALYSIS, ROUTINE W REFLEX MICROSCOPIC
Bilirubin Urine: NEGATIVE
Glucose, UA: NEGATIVE mg/dL
Hgb urine dipstick: NEGATIVE
KETONES UR: NEGATIVE mg/dL
LEUKOCYTES UA: NEGATIVE
NITRITE: NEGATIVE
PROTEIN: NEGATIVE mg/dL
Specific Gravity, Urine: 1.025 (ref 1.005–1.030)
Urobilinogen, UA: 1 mg/dL (ref 0.0–1.0)
pH: 5.5 (ref 5.0–8.0)

## 2015-09-02 MED ORDER — IBUPROFEN 800 MG PO TABS
800.0000 mg | ORAL_TABLET | Freq: Once | ORAL | Status: AC
  Administered 2015-09-02: 800 mg via ORAL
  Filled 2015-09-02: qty 1
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
## 2015-09-02 NOTE — Discharge Instructions (Signed)

## 2015-09-02 NOTE — ED Notes (Signed)
Patient states he has a two day history of eye pain and lightheadedness.  States when he turns his head fast, he is dizzy.  Also started with a sore throat and lower back pain.

## 2016-01-19 ENCOUNTER — Emergency Department (HOSPITAL_BASED_OUTPATIENT_CLINIC_OR_DEPARTMENT_OTHER)
Admission: EM | Admit: 2016-01-19 | Discharge: 2016-01-19 | Disposition: A | Discharge status: Home / Self Care | Payer: Self-pay | Attending: Emergency Medicine | Admitting: Emergency Medicine

## 2016-01-19 ENCOUNTER — Encounter (HOSPITAL_BASED_OUTPATIENT_CLINIC_OR_DEPARTMENT_OTHER): Payer: Self-pay | Admitting: *Deleted

## 2016-01-19 DIAGNOSIS — R39198 Other difficulties with micturition: Secondary | ICD-10-CM | POA: Insufficient documentation

## 2016-01-19 DIAGNOSIS — Z87828 Personal history of other (healed) physical injury and trauma: Secondary | ICD-10-CM | POA: Insufficient documentation

## 2016-01-19 DIAGNOSIS — F17210 Nicotine dependence, cigarettes, uncomplicated: Secondary | ICD-10-CM | POA: Insufficient documentation

## 2016-01-19 DIAGNOSIS — Z202 Contact with and (suspected) exposure to infections with a predominantly sexual mode of transmission: Secondary | ICD-10-CM | POA: Insufficient documentation

## 2016-01-19 DIAGNOSIS — Z8619 Personal history of other infectious and parasitic diseases: Secondary | ICD-10-CM | POA: Insufficient documentation

## 2016-01-19 LAB — URINALYSIS, ROUTINE W REFLEX MICROSCOPIC
BILIRUBIN URINE: NEGATIVE
Glucose, UA: NEGATIVE mg/dL
Hgb urine dipstick: NEGATIVE
KETONES UR: NEGATIVE mg/dL
NITRITE: NEGATIVE
PH: 6.5 (ref 5.0–8.0)
Protein, ur: NEGATIVE mg/dL
SPECIFIC GRAVITY, URINE: 1.024 (ref 1.005–1.030)

## 2016-01-19 LAB — URINE MICROSCOPIC-ADD ON
RBC / HPF: NONE SEEN RBC/hpf (ref 0–5)
SQUAMOUS EPITHELIAL / LPF: NONE SEEN

## 2016-01-19 MED ORDER — CEFTRIAXONE SODIUM 250 MG IJ SOLR
250.0000 mg | Freq: Once | INTRAMUSCULAR | Status: AC
  Administered 2016-01-19: 250 mg via INTRAMUSCULAR
  Filled 2016-01-19: qty 250

## 2016-01-19 MED ORDER — METRONIDAZOLE 500 MG PO TABS
2000.0000 mg | ORAL_TABLET | Freq: Once | ORAL | Status: AC
  Administered 2016-01-19: 2000 mg via ORAL
  Filled 2016-01-19: qty 4

## 2016-01-19 NOTE — ED Notes (Signed)
STD exposure
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
## 2016-01-19 NOTE — Discharge Instructions (Signed)
Sexually Transmitted Disease °A sexually transmitted disease (STD) is a disease or infection that may be passed (transmitted) from person to person, usually during sexual activity. This may happen by way of saliva, semen, blood, vaginal mucus, or urine. Common STDs include: °· Gonorrhea. °· Chlamydia. °· Syphilis. °· HIV and AIDS. °· Genital herpes. °· Hepatitis B and C. °· Trichomonas. °· Human papillomavirus (HPV). °· Pubic lice. °· Scabies. °· Mites. °· Bacterial vaginosis. °WHAT ARE CAUSES OF STDs? °An STD may be caused by bacteria, a virus, or parasites. STDs are often transmitted during sexual activity if one person is infected. However, they may also be transmitted through nonsexual means. STDs may be transmitted after:  °· Sexual intercourse with an infected person. °· Sharing sex toys with an infected person. °· Sharing needles with an infected person or using unclean piercing or tattoo needles. °· Having intimate contact with the genitals, mouth, or rectal areas of an infected person. °· Exposure to infected fluids during birth. °WHAT ARE THE SIGNS AND SYMPTOMS OF STDs? °Different STDs have different symptoms. Some people may not have any symptoms. If symptoms are present, they may include: °· Painful or bloody urination. °· Pain in the pelvis, abdomen, vagina, anus, throat, or eyes. °· A skin rash, itching, or irritation. °· Growths, ulcerations, blisters, or sores in the genital and anal areas. °· Abnormal vaginal discharge with or without bad odor. °· Penile discharge in men. °· Fever. °· Pain or bleeding during sexual intercourse. °· Swollen glands in the groin area. °· Yellow skin and eyes (jaundice). This is seen with hepatitis. °· Swollen testicles. °· Infertility. °· Sores and blisters in the mouth. °HOW ARE STDs DIAGNOSED? °To make a diagnosis, your health care provider may: °· Take a medical history. °· Perform a physical exam. °· Take a sample of any discharge to examine. °· Swab the throat,  cervix, opening to the penis, rectum, or vagina for testing. °· Test a sample of your first morning urine. °· Perform blood tests. °· Perform a Pap test, if this applies. °· Perform a colposcopy. °· Perform a laparoscopy. °HOW ARE STDs TREATED? °Treatment depends on the STD. Some STDs may be treated but not cured. °· Chlamydia, gonorrhea, trichomonas, and syphilis can be cured with antibiotic medicine. °· Genital herpes, hepatitis, and HIV can be treated, but not cured, with prescribed medicines. The medicines lessen symptoms. °· Genital warts from HPV can be treated with medicine or by freezing, burning (electrocautery), or surgery. Warts may come back. °· HPV cannot be cured with medicine or surgery. However, abnormal areas may be removed from the cervix, vagina, or vulva. °· If your diagnosis is confirmed, your recent sexual partners need treatment. This is true even if they are symptom-free or have a negative culture or evaluation. They should not have sex until their health care providers say it is okay. °· Your health care provider may test you for infection again 3 months after treatment. °HOW CAN I REDUCE MY RISK OF GETTING AN STD? °Take these steps to reduce your risk of getting an STD: °· Use latex condoms, dental dams, and water-soluble lubricants during sexual activity. Do not use petroleum jelly or oils. °· Avoid having multiple sex partners. °· Do not have sex with someone who has other sex partners °· Do not have sex with anyone you do not know or who is at high risk for an STD. °· Avoid risky sex practices that can break your skin. °· Do not have sex   if you have open sores on your mouth or skin. °· Avoid drinking too much alcohol or taking illegal drugs. Alcohol and drugs can affect your judgment and put you in a vulnerable position. °· Avoid engaging in oral and anal sex acts. °· Get vaccinated for HPV and hepatitis. If you have not received these vaccines in the past, talk to your health care  provider about whether one or both might be right for you. °· If you are at risk of being infected with HIV, it is recommended that you take a prescription medicine daily to prevent HIV infection. This is called pre-exposure prophylaxis (PrEP). You are considered at risk if: °¨ You are a man who has sex with other men (MSM). °¨ You are a heterosexual man or woman and are sexually active with more than one partner. °¨ You take drugs by injection. °¨ You are sexually active with a partner who has HIV. °· Talk with your health care provider about whether you are at high risk of being infected with HIV. If you choose to begin PrEP, you should first be tested for HIV. You should then be tested every 3 months for as long as you are taking PrEP. °WHAT SHOULD I DO IF I THINK I HAVE AN STD? °· See your health care provider. °· Tell your sexual partner(s). They should be tested and treated for any STDs. °· Do not have sex until your health care provider says it is okay. °WHEN SHOULD I GET IMMEDIATE MEDICAL CARE? °Contact your health care provider right away if:  °· You have severe abdominal pain. °· You are a man and notice swelling or pain in your testicles. °· You are a woman and notice swelling or pain in your vagina. °  °This information is not intended to replace advice given to you by your health care provider. Make sure you discuss any questions you have with your health care provider. °  °Document Released: 01/01/2003 Document Revised: 11/01/2014 Document Reviewed: 05/01/2013 °Elsevier Interactive Patient Education ©2016 [employer] ° °

## 2016-01-20 LAB — GC/CHLAMYDIA PROBE AMP (~~LOC~~) NOT AT ARMC
CHLAMYDIA, DNA PROBE: NEGATIVE
NEISSERIA GONORRHEA: NEGATIVE

## 2016-01-21 LAB — RPR: RPR: NONREACTIVE

## 2016-01-21 LAB — HIV ANTIBODY (ROUTINE TESTING W REFLEX): HIV Screen 4th Generation wRfx: NONREACTIVE

## 2016-07-22 ENCOUNTER — Encounter (HOSPITAL_BASED_OUTPATIENT_CLINIC_OR_DEPARTMENT_OTHER): Payer: Self-pay | Admitting: Emergency Medicine

## 2016-07-22 ENCOUNTER — Emergency Department (HOSPITAL_BASED_OUTPATIENT_CLINIC_OR_DEPARTMENT_OTHER)
Admission: EM | Admit: 2016-07-22 | Discharge: 2016-07-22 | Disposition: A | Discharge status: Home / Self Care | Payer: Self-pay | Attending: Emergency Medicine | Admitting: Emergency Medicine

## 2016-07-22 DIAGNOSIS — F17210 Nicotine dependence, cigarettes, uncomplicated: Secondary | ICD-10-CM | POA: Insufficient documentation

## 2016-07-22 DIAGNOSIS — Z202 Contact with and (suspected) exposure to infections with a predominantly sexual mode of transmission: Secondary | ICD-10-CM | POA: Insufficient documentation

## 2016-07-22 LAB — URINALYSIS, ROUTINE W REFLEX MICROSCOPIC
BILIRUBIN URINE: NEGATIVE
GLUCOSE, UA: NEGATIVE mg/dL
Hgb urine dipstick: NEGATIVE
KETONES UR: NEGATIVE mg/dL
Nitrite: NEGATIVE
PH: 6 (ref 5.0–8.0)
Protein, ur: NEGATIVE mg/dL
Specific Gravity, Urine: 1.022 (ref 1.005–1.030)

## 2016-07-22 LAB — URINE MICROSCOPIC-ADD ON: RBC / HPF: NONE SEEN RBC/hpf (ref 0–5)

## 2016-07-22 MED ORDER — METRONIDAZOLE 500 MG PO TABS
2000.0000 mg | ORAL_TABLET | Freq: Once | ORAL | Status: AC
  Administered 2016-07-22: 2000 mg via ORAL
  Filled 2016-07-22: qty 4

## 2016-07-22 MED ORDER — AZITHROMYCIN 250 MG PO TABS
1000.0000 mg | ORAL_TABLET | Freq: Once | ORAL | Status: AC
  Administered 2016-07-22: 1000 mg via ORAL
  Filled 2016-07-22: qty 4

## 2016-07-22 MED ORDER — CEFTRIAXONE SODIUM 250 MG IJ SOLR
250.0000 mg | Freq: Once | INTRAMUSCULAR | Status: AC
  Administered 2016-07-22: 250 mg via INTRAMUSCULAR
  Filled 2016-07-22: qty 250
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
## 2016-07-22 NOTE — ED Triage Notes (Signed)
Patient states that his partner was positive for an STD - Trich. Patient would like to be tested
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
## 2016-07-23 LAB — GC/CHLAMYDIA PROBE AMP (~~LOC~~) NOT AT ARMC
Chlamydia: NEGATIVE
Neisseria Gonorrhea: NEGATIVE

## 2016-07-23 LAB — HIV ANTIBODY (ROUTINE TESTING W REFLEX): HIV SCREEN 4TH GENERATION: NONREACTIVE

## 2016-07-23 LAB — RPR: RPR Ser Ql: NONREACTIVE

## 2016-08-03 ENCOUNTER — Emergency Department (HOSPITAL_BASED_OUTPATIENT_CLINIC_OR_DEPARTMENT_OTHER): Payer: Self-pay

## 2016-08-03 ENCOUNTER — Emergency Department (HOSPITAL_BASED_OUTPATIENT_CLINIC_OR_DEPARTMENT_OTHER)
Admission: EM | Admit: 2016-08-03 | Discharge: 2016-08-03 | Disposition: A | Discharge status: Home / Self Care | Payer: Self-pay | Attending: Emergency Medicine | Admitting: Emergency Medicine

## 2016-08-03 ENCOUNTER — Encounter (HOSPITAL_BASED_OUTPATIENT_CLINIC_OR_DEPARTMENT_OTHER): Payer: Self-pay

## 2016-08-03 DIAGNOSIS — R05 Cough: Secondary | ICD-10-CM | POA: Insufficient documentation

## 2016-08-03 DIAGNOSIS — R011 Cardiac murmur, unspecified: Secondary | ICD-10-CM | POA: Insufficient documentation

## 2016-08-03 DIAGNOSIS — R079 Chest pain, unspecified: Secondary | ICD-10-CM | POA: Insufficient documentation

## 2016-08-03 DIAGNOSIS — R221 Localized swelling, mass and lump, neck: Secondary | ICD-10-CM | POA: Insufficient documentation

## 2016-08-03 DIAGNOSIS — F17210 Nicotine dependence, cigarettes, uncomplicated: Secondary | ICD-10-CM | POA: Insufficient documentation

## 2016-08-03 DIAGNOSIS — R002 Palpitations: Secondary | ICD-10-CM | POA: Insufficient documentation
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
## 2016-08-03 NOTE — ED Triage Notes (Signed)
Pt c/o swelling under left side of chin for the last two hours, started drinking a lot of water thinking that would make the swelling go down, then he states he felt like his heart was beating fast and he started having chest pain in the left side.

## 2016-08-03 NOTE — ED Notes (Signed)
Pt verbalizes understanding of d/c instructions and denies any further needs at this time. 

## 2016-10-30 ENCOUNTER — Encounter (HOSPITAL_BASED_OUTPATIENT_CLINIC_OR_DEPARTMENT_OTHER): Payer: Self-pay | Admitting: Emergency Medicine

## 2016-10-30 ENCOUNTER — Emergency Department (HOSPITAL_BASED_OUTPATIENT_CLINIC_OR_DEPARTMENT_OTHER): Payer: Self-pay

## 2016-10-30 ENCOUNTER — Emergency Department (HOSPITAL_BASED_OUTPATIENT_CLINIC_OR_DEPARTMENT_OTHER)
Admission: EM | Admit: 2016-10-30 | Discharge: 2016-10-30 | Disposition: A | Discharge status: Home / Self Care | Payer: Self-pay | Attending: Emergency Medicine | Admitting: Emergency Medicine

## 2016-10-30 DIAGNOSIS — J09X2 Influenza due to identified novel influenza A virus with other respiratory manifestations: Secondary | ICD-10-CM | POA: Insufficient documentation

## 2016-10-30 DIAGNOSIS — J101 Influenza due to other identified influenza virus with other respiratory manifestations: Secondary | ICD-10-CM

## 2016-10-30 DIAGNOSIS — R8299 Other abnormal findings in urine: Secondary | ICD-10-CM | POA: Insufficient documentation

## 2016-10-30 DIAGNOSIS — F17210 Nicotine dependence, cigarettes, uncomplicated: Secondary | ICD-10-CM | POA: Insufficient documentation

## 2016-10-30 DIAGNOSIS — J189 Pneumonia, unspecified organism: Secondary | ICD-10-CM

## 2016-10-30 DIAGNOSIS — N179 Acute kidney failure, unspecified: Secondary | ICD-10-CM

## 2016-10-30 LAB — URINALYSIS, ROUTINE W REFLEX MICROSCOPIC
GLUCOSE, UA: NEGATIVE mg/dL
Hgb urine dipstick: NEGATIVE
Ketones, ur: 15 mg/dL — AB
Nitrite: POSITIVE — AB
PH: 5 (ref 5.0–8.0)
PROTEIN: 30 mg/dL — AB
Specific Gravity, Urine: 1.024 (ref 1.005–1.030)

## 2016-10-30 LAB — CBC WITH DIFFERENTIAL/PLATELET
Basophils Absolute: 0 10*3/uL (ref 0.0–0.1)
Basophils Relative: 0 %
Eosinophils Absolute: 0 10*3/uL (ref 0.0–0.7)
Eosinophils Relative: 0 %
HEMATOCRIT: 48.4 % (ref 39.0–52.0)
HEMOGLOBIN: 16.5 g/dL (ref 13.0–17.0)
LYMPHS ABS: 1.7 10*3/uL (ref 0.7–4.0)
LYMPHS PCT: 15 %
MCH: 28.9 pg (ref 26.0–34.0)
MCHC: 34.1 g/dL (ref 30.0–36.0)
MCV: 84.8 fL (ref 78.0–100.0)
MONOS PCT: 14 %
Monocytes Absolute: 1.6 10*3/uL — ABNORMAL HIGH (ref 0.1–1.0)
NEUTROS ABS: 7.9 10*3/uL — AB (ref 1.7–7.7)
NEUTROS PCT: 71 %
Platelets: 177 10*3/uL (ref 150–400)
RBC: 5.71 MIL/uL (ref 4.22–5.81)
RDW: 14.5 % (ref 11.5–15.5)
WBC: 11.2 10*3/uL — ABNORMAL HIGH (ref 4.0–10.5)

## 2016-10-30 LAB — COMPREHENSIVE METABOLIC PANEL
ALBUMIN: 4.3 g/dL (ref 3.5–5.0)
ALT: 22 U/L (ref 17–63)
AST: 25 U/L (ref 15–41)
Alkaline Phosphatase: 75 U/L (ref 38–126)
Anion gap: 10 (ref 5–15)
BILIRUBIN TOTAL: 1.1 mg/dL (ref 0.3–1.2)
BUN: 13 mg/dL (ref 6–20)
CHLORIDE: 103 mmol/L (ref 101–111)
CO2: 23 mmol/L (ref 22–32)
Calcium: 8.6 mg/dL — ABNORMAL LOW (ref 8.9–10.3)
Creatinine, Ser: 1.76 mg/dL — ABNORMAL HIGH (ref 0.61–1.24)
GFR calc Af Amer: 58 mL/min — ABNORMAL LOW (ref >60)
GFR calc non Af Amer: 50 mL/min — ABNORMAL LOW (ref >60)
GLUCOSE: 129 mg/dL — AB (ref 65–99)
POTASSIUM: 3.8 mmol/L (ref 3.5–5.1)
Sodium: 136 mmol/L (ref 135–145)
TOTAL PROTEIN: 8 g/dL (ref 6.5–8.1)

## 2016-10-30 LAB — PROTIME-INR
INR: 0.98
Prothrombin Time: 13 seconds (ref 11.4–15.2)

## 2016-10-30 LAB — URINALYSIS, MICROSCOPIC (REFLEX): RBC / HPF: NONE SEEN RBC/hpf (ref 0–5)

## 2016-10-30 LAB — INFLUENZA PANEL BY PCR (TYPE A & B)
INFLBPCR: NEGATIVE
Influenza A By PCR: POSITIVE — AB

## 2016-10-30 LAB — I-STAT CG4 LACTIC ACID, ED: Lactic Acid, Venous: 2 mmol/L (ref 0.5–1.9)

## 2016-10-30 MED ORDER — AZITHROMYCIN 250 MG PO TABS
500.0000 mg | ORAL_TABLET | Freq: Once | ORAL | Status: AC
  Administered 2016-10-30: 500 mg via ORAL
  Filled 2016-10-30: qty 2

## 2016-10-30 MED ORDER — SODIUM CHLORIDE 0.9 % IV BOLUS (SEPSIS)
1000.0000 mL | Freq: Once | INTRAVENOUS | Status: AC
  Administered 2016-10-30: 1000 mL via INTRAVENOUS

## 2016-10-30 MED ORDER — ACETAMINOPHEN 325 MG PO TABS
650.0000 mg | ORAL_TABLET | Freq: Once | ORAL | Status: AC
  Administered 2016-10-30: 650 mg via ORAL
  Filled 2016-10-30: qty 2

## 2016-10-30 MED ORDER — AZITHROMYCIN 250 MG PO TABS: 250.0000 mg | ORAL_TABLET | Freq: Every day | ORAL | 0 refills | Status: DC

## 2016-10-30 MED ORDER — DEXTROSE 5 % IV SOLN
1.0000 g | Freq: Once | INTRAVENOUS | Status: AC
  Administered 2016-10-30: 1 g via INTRAVENOUS
  Filled 2016-10-30: qty 10

## 2016-10-30 NOTE — ED Notes (Signed)
Pt is diaphoretic in appearnce, and, tachypneic. EDP notified of pt's V/S and code sepsis initiated.

## 2016-10-30 NOTE — ED Notes (Addendum)
Consulted with Dr. Anitra LauthPlunkett and she did not want to do the 6330mL/kg fluid bolus at this time.

## 2016-10-30 NOTE — ED Notes (Signed)
Patient transported to X-ray 

## 2016-10-30 NOTE — ED Notes (Signed)
Code Sepsis called via [facility] per RN

## 2016-10-30 NOTE — ED Notes (Signed)
Code sepsis cancelled 

## 2016-10-30 NOTE — ED Triage Notes (Addendum)
Fever, bodyaches x 3 days. Denies N/V, pt is diaphoretic
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
## 2016-10-30 NOTE — ED Provider Notes (Signed)
Labs are consistent with positive influenza A, mildly elevated lactate of 2 and acute kidney injury with a creatinine of 1.76. Mild leukocytosis of 11,000 and chest x-ray concerning for a subtle left lung pneumonia. Patient was treated with Rocephin and azithromycin. After IV fluids vital signs improved significantly. Patient feels much better and is requesting to go home. He was given azithromycin to go home area he is outside of the window for Tamiflu. Urine appears concentrated in contaminated but no distinct urinary tract infection. Discussed strict return precautions, persistent fever, vomiting, shortness of breath or confusion. Discussed staying well-hydrated. Feel that patient is appropriate for discharge.   Gwyneth SproutWhitney Alice Vitelli, MD 10/30/16 1810

## 2016-10-31 LAB — URINE CULTURE: Culture: <10000 — AB

## 2016-11-04 LAB — CULTURE, BLOOD (ROUTINE X 2)
CULTURE: NO GROWTH
Culture: NO GROWTH

## 2019-03-16 ENCOUNTER — Encounter (HOSPITAL_BASED_OUTPATIENT_CLINIC_OR_DEPARTMENT_OTHER): Payer: Self-pay | Admitting: Emergency Medicine

## 2019-03-16 ENCOUNTER — Emergency Department (HOSPITAL_BASED_OUTPATIENT_CLINIC_OR_DEPARTMENT_OTHER)
Admission: EM | Admit: 2019-03-16 | Discharge: 2019-03-16 | Disposition: A | Discharge status: Home / Self Care | Payer: Self-pay | Attending: Emergency Medicine | Admitting: Emergency Medicine

## 2019-03-16 ENCOUNTER — Other Ambulatory Visit: Payer: Self-pay

## 2019-03-16 DIAGNOSIS — Z202 Contact with and (suspected) exposure to infections with a predominantly sexual mode of transmission: Secondary | ICD-10-CM | POA: Insufficient documentation

## 2019-03-16 DIAGNOSIS — R030 Elevated blood-pressure reading, without diagnosis of hypertension: Secondary | ICD-10-CM | POA: Insufficient documentation

## 2019-03-16 DIAGNOSIS — F17210 Nicotine dependence, cigarettes, uncomplicated: Secondary | ICD-10-CM | POA: Insufficient documentation

## 2019-03-16 LAB — URINALYSIS, ROUTINE W REFLEX MICROSCOPIC
Bilirubin Urine: NEGATIVE
Glucose, UA: NEGATIVE mg/dL
Hgb urine dipstick: NEGATIVE
Ketones, ur: NEGATIVE mg/dL
Leukocytes,Ua: NEGATIVE
Nitrite: NEGATIVE
Protein, ur: NEGATIVE mg/dL
Specific Gravity, Urine: 1.025 (ref 1.005–1.030)
pH: 6 (ref 5.0–8.0)

## 2019-03-16 MED ORDER — CEFTRIAXONE SODIUM 250 MG IJ SOLR
250.0000 mg | Freq: Once | INTRAMUSCULAR | Status: AC
  Administered 2019-03-16: 02:00:00 250 mg via INTRAMUSCULAR
  Filled 2019-03-16: qty 250

## 2019-03-16 MED ORDER — AZITHROMYCIN 250 MG PO TABS
1000.0000 mg | ORAL_TABLET | Freq: Once | ORAL | Status: AC
  Administered 2019-03-16: 02:00:00 1000 mg via ORAL
  Filled 2019-03-16: qty 4

## 2019-03-16 MED ORDER — METRONIDAZOLE 500 MG PO TABS
2000.0000 mg | ORAL_TABLET | Freq: Once | ORAL | Status: AC
  Administered 2019-03-16: 02:00:00 2000 mg via ORAL
  Filled 2019-03-16: qty 4

## 2019-03-16 NOTE — Discharge Instructions (Addendum)
Make sure all sexual contacts are treated.  Do not have sex with your partner until she has been treated, or you will get the infection back.  Your blood pressure was a little high today. Please have it checked several times in the next 1-2 weeks. If it is consistently high, you will need to be on medication to control it. Uncontrolled high blood pressure leads to heart attacks, strokes, kidney failure.
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
## 2019-03-16 NOTE — ED Triage Notes (Signed)
Pt reports partner tested positive for chlamydia.

## 2019-03-17 LAB — HIV ANTIBODY (ROUTINE TESTING W REFLEX): HIV Screen 4th Generation wRfx: NONREACTIVE

## 2019-03-17 LAB — RPR: RPR Ser Ql: NONREACTIVE

## 2019-07-13 ENCOUNTER — Encounter (HOSPITAL_BASED_OUTPATIENT_CLINIC_OR_DEPARTMENT_OTHER): Payer: Self-pay | Admitting: Emergency Medicine

## 2019-07-13 ENCOUNTER — Emergency Department (HOSPITAL_BASED_OUTPATIENT_CLINIC_OR_DEPARTMENT_OTHER)
Admission: EM | Admit: 2019-07-13 | Discharge: 2019-07-13 | Disposition: A | Discharge status: Home / Self Care | Payer: Self-pay | Attending: Emergency Medicine | Admitting: Emergency Medicine

## 2019-07-13 ENCOUNTER — Other Ambulatory Visit: Payer: Self-pay | Admitting: Emergency Medicine

## 2019-07-13 ENCOUNTER — Other Ambulatory Visit: Payer: Self-pay

## 2019-07-13 DIAGNOSIS — F17210 Nicotine dependence, cigarettes, uncomplicated: Secondary | ICD-10-CM | POA: Insufficient documentation

## 2019-07-13 DIAGNOSIS — F1210 Cannabis abuse, uncomplicated: Secondary | ICD-10-CM | POA: Insufficient documentation

## 2019-07-13 DIAGNOSIS — Z202 Contact with and (suspected) exposure to infections with a predominantly sexual mode of transmission: Secondary | ICD-10-CM | POA: Insufficient documentation

## 2019-07-13 MED ORDER — METRONIDAZOLE 500 MG PO TABS
2000.0000 mg | ORAL_TABLET | Freq: Once | ORAL | Status: AC
  Administered 2019-07-13: 03:00:00 2000 mg via ORAL
  Filled 2019-07-13: qty 4

## 2019-07-13 NOTE — ED Triage Notes (Addendum)
Arrives c/o exposure to trichomonas. Hx of same. Denies symptoms. On the phone throughout entire triage process.
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
## 2019-07-14 LAB — URINE CYTOLOGY ANCILLARY ONLY
Chlamydia: NEGATIVE
Neisseria Gonorrhea: NEGATIVE

## 2020-08-27 ENCOUNTER — Emergency Department (HOSPITAL_BASED_OUTPATIENT_CLINIC_OR_DEPARTMENT_OTHER)
Admission: EM | Admit: 2020-08-27 | Discharge: 2020-08-28 | Disposition: A | Discharge status: Home / Self Care | Payer: Self-pay | Attending: Emergency Medicine | Admitting: Emergency Medicine

## 2020-08-27 ENCOUNTER — Encounter (HOSPITAL_BASED_OUTPATIENT_CLINIC_OR_DEPARTMENT_OTHER): Payer: Self-pay | Admitting: Emergency Medicine

## 2020-08-27 DIAGNOSIS — F17210 Nicotine dependence, cigarettes, uncomplicated: Secondary | ICD-10-CM | POA: Insufficient documentation

## 2020-08-27 DIAGNOSIS — R109 Unspecified abdominal pain: Secondary | ICD-10-CM | POA: Insufficient documentation

## 2020-08-27 DIAGNOSIS — R059 Cough, unspecified: Secondary | ICD-10-CM | POA: Insufficient documentation

## 2020-08-27 DIAGNOSIS — R067 Sneezing: Secondary | ICD-10-CM | POA: Insufficient documentation
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
## 2020-08-27 NOTE — ED Triage Notes (Signed)
Pt states a couple of days ago he was bent over and he coughed then sneezed and felt like something tore in his left flank area  Pt states he has had pain since whenever he coughs or moves certain ways

## 2020-08-28 ENCOUNTER — Emergency Department (HOSPITAL_BASED_OUTPATIENT_CLINIC_OR_DEPARTMENT_OTHER): Payer: Self-pay

## 2020-08-28 LAB — BASIC METABOLIC PANEL
Anion gap: 8 (ref 5–15)
BUN: 13 mg/dL (ref 6–20)
CO2: 26 mmol/L (ref 22–32)
Calcium: 9.3 mg/dL (ref 8.9–10.3)
Chloride: 104 mmol/L (ref 98–111)
Creatinine, Ser: 0.91 mg/dL (ref 0.61–1.24)
GFR, Estimated: >60 mL/min (ref >60)
Glucose, Bld: 102 mg/dL — ABNORMAL HIGH (ref 70–99)
Potassium: 4.2 mmol/L (ref 3.5–5.1)
Sodium: 138 mmol/L (ref 135–145)

## 2020-08-28 LAB — CBC WITH DIFFERENTIAL/PLATELET
Abs Immature Granulocytes: 0.02 10*3/uL (ref 0.00–0.07)
Basophils Absolute: 0.1 10*3/uL (ref 0.0–0.1)
Basophils Relative: 1 %
Eosinophils Absolute: 0.2 10*3/uL (ref 0.0–0.5)
Eosinophils Relative: 3 %
HCT: 45.6 % (ref 39.0–52.0)
Hemoglobin: 15.2 g/dL (ref 13.0–17.0)
Immature Granulocytes: 0 %
Lymphocytes Relative: 33 %
Lymphs Abs: 2.6 10*3/uL (ref 0.7–4.0)
MCH: 29 pg (ref 26.0–34.0)
MCHC: 33.3 g/dL (ref 30.0–36.0)
MCV: 87 fL (ref 80.0–100.0)
Monocytes Absolute: 1.2 10*3/uL — ABNORMAL HIGH (ref 0.1–1.0)
Monocytes Relative: 15 %
Neutro Abs: 3.8 10*3/uL (ref 1.7–7.7)
Neutrophils Relative %: 48 %
Platelets: 237 10*3/uL (ref 150–400)
RBC: 5.24 MIL/uL (ref 4.22–5.81)
RDW: 14.4 % (ref 11.5–15.5)
WBC: 7.9 10*3/uL (ref 4.0–10.5)
nRBC: 0 % (ref 0.0–0.2)

## 2020-08-28 LAB — URINALYSIS, ROUTINE W REFLEX MICROSCOPIC
Bilirubin Urine: NEGATIVE
Glucose, UA: NEGATIVE mg/dL
Hgb urine dipstick: NEGATIVE
Ketones, ur: NEGATIVE mg/dL
Leukocytes,Ua: NEGATIVE
Nitrite: NEGATIVE
Protein, ur: NEGATIVE mg/dL
Specific Gravity, Urine: 1.015 (ref 1.005–1.030)
pH: 8 (ref 5.0–8.0)

## 2020-08-28 NOTE — Discharge Instructions (Addendum)
Begin taking ibuprofen 600 mg every 6 hours as needed for pain.  Rest.  Follow-up with your primary doctor if symptoms or not improving in the next week, and return to the ER if you develop worsening pain, high fever, bloody stool, or other new and concerning symptoms.

## 2022-01-17 IMAGING — CT CT RENAL STONE PROTOCOL
2 of 4 series · 17 of 46 positions shown, 19 images · non-contrast
Comparison: None.

CLINICAL DATA: Left flank pain

EXAM:
CT ABDOMEN AND PELVIS WITHOUT CONTRAST
TECHNIQUE: Multidetector CT imaging of the abdomen and pelvis was performed
following the standard protocol without IV contrast.

[Series 2: axial st · axial · 0.98mm/px · z∈[+556,+1016]mm · 14 of 100 slices shown, 16 images]
[im 4/100  soft-tissue]
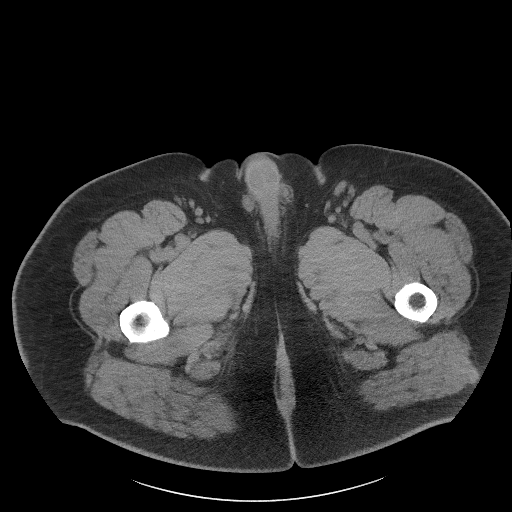
[im 4/100  bone]
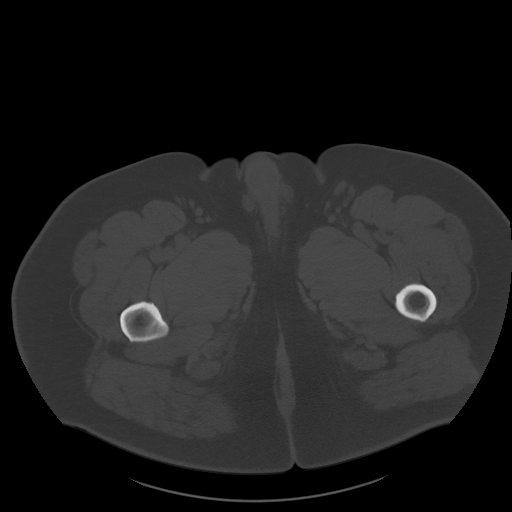
[im 12/100  soft-tissue]
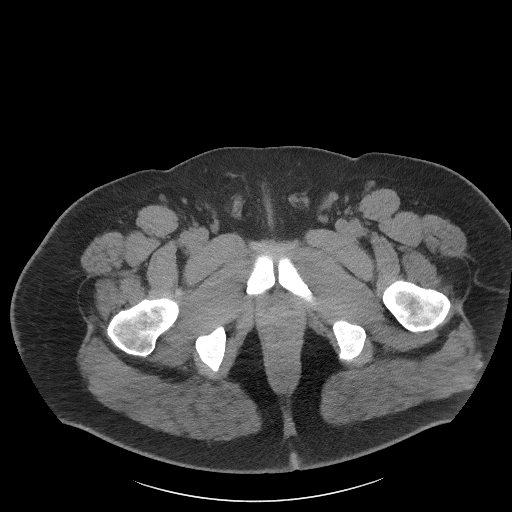
[im 20/100  soft-tissue]
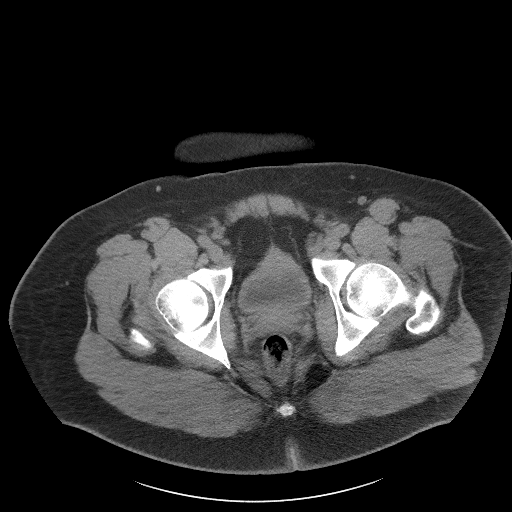
[im 27/100  soft-tissue]
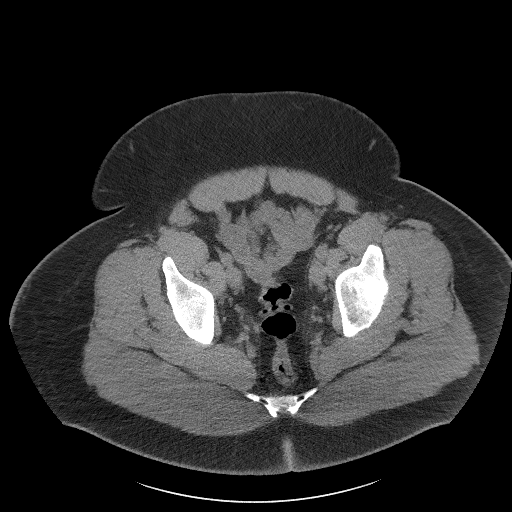
[im 35/100  soft-tissue]
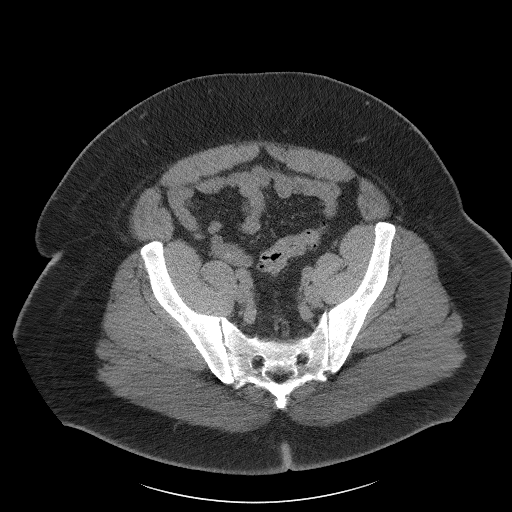
[im 39/100  soft-tissue]
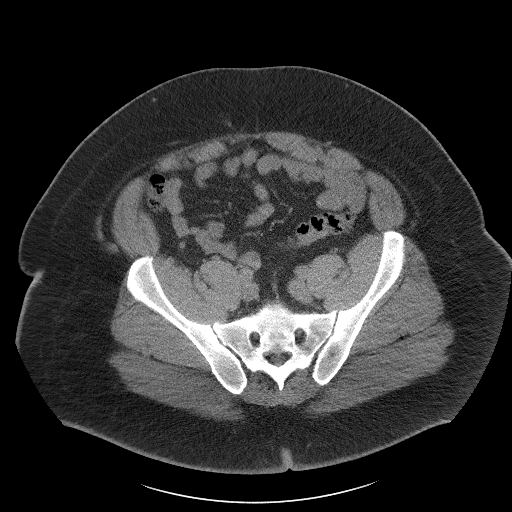
[im 46/100  soft-tissue]
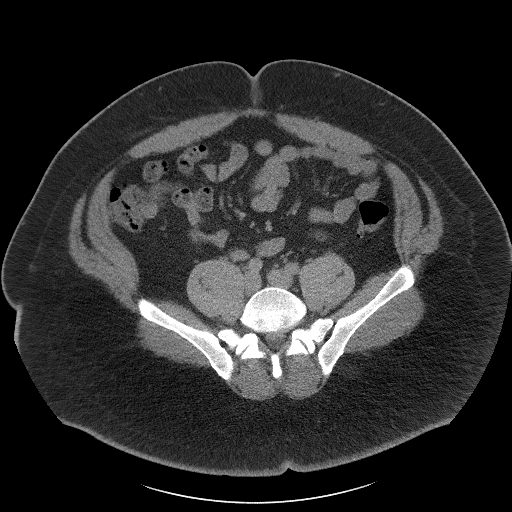
[im 54/100  soft-tissue]
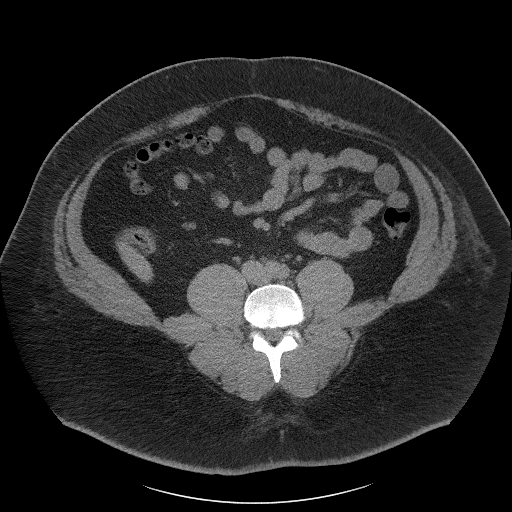
[im 61/100  soft-tissue]
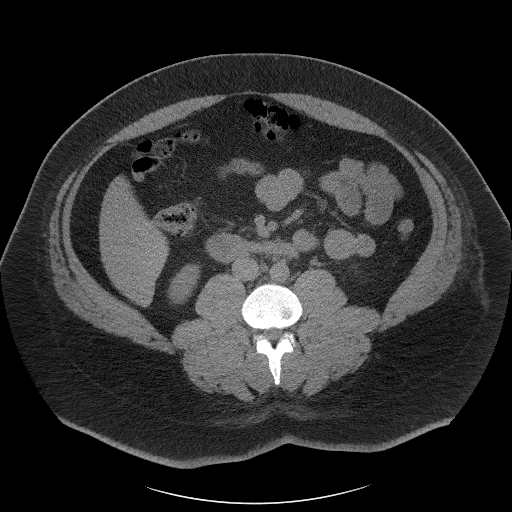
[im 61/100  bone]
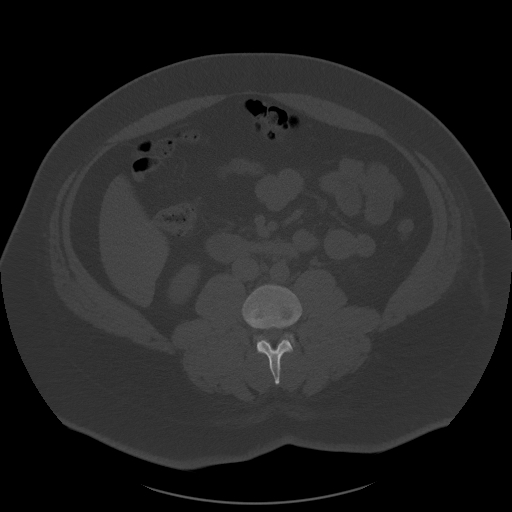
[im 65/100  soft-tissue]
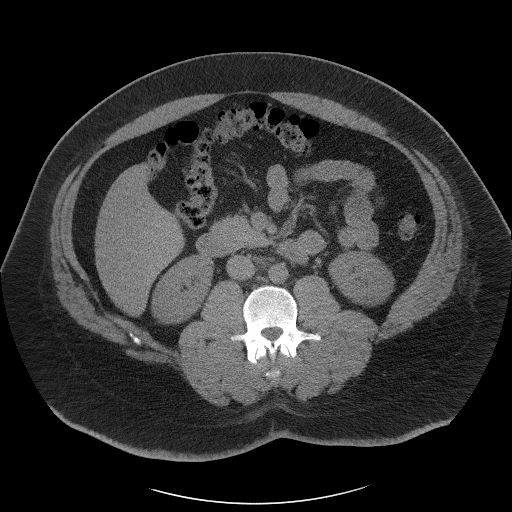
[im 73/100  soft-tissue]
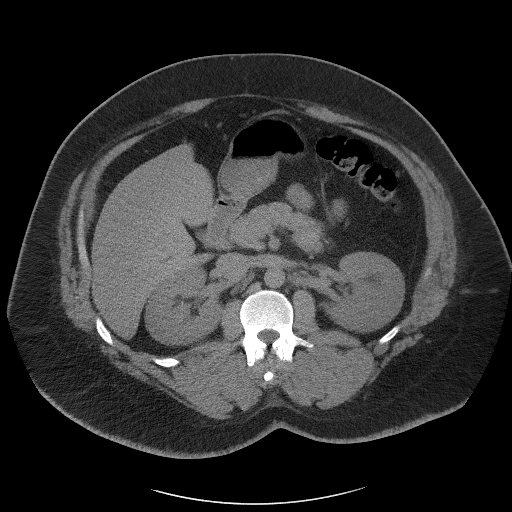
[im 80/100  soft-tissue]
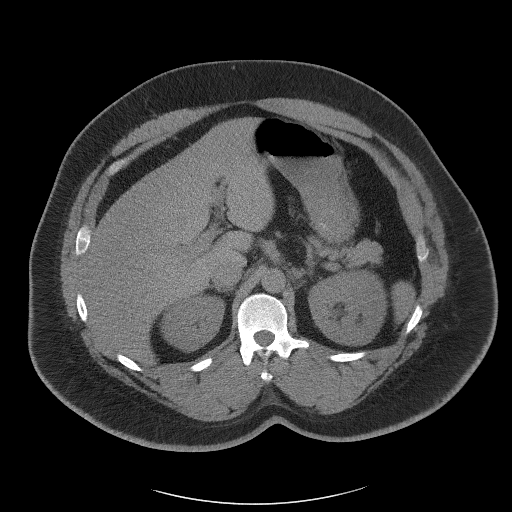
[im 88/100  soft-tissue]
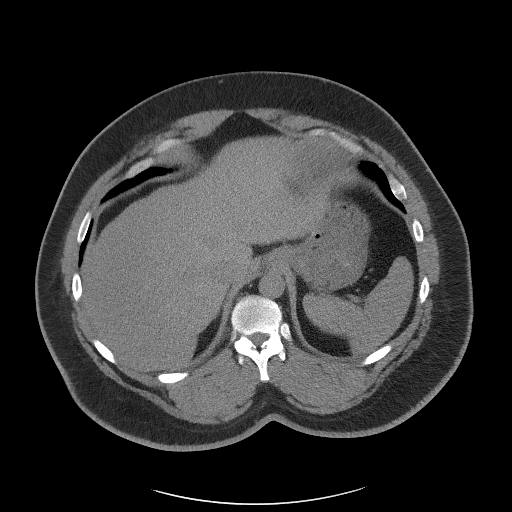
[im 96/100  soft-tissue]
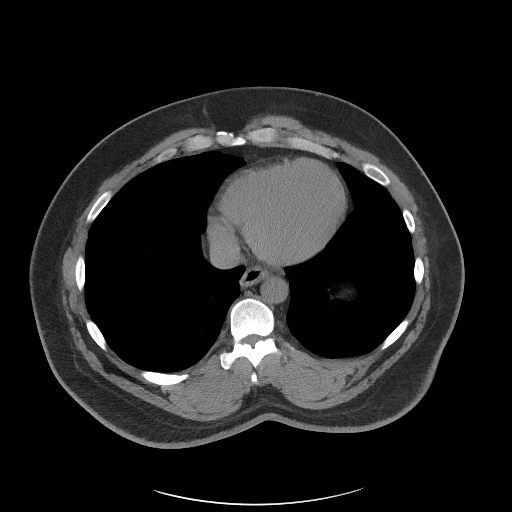

[Series 4: coronal st · coronal · 0.98mm/px · 3 of 147 slices shown]
[im 49/147  soft-tissue]
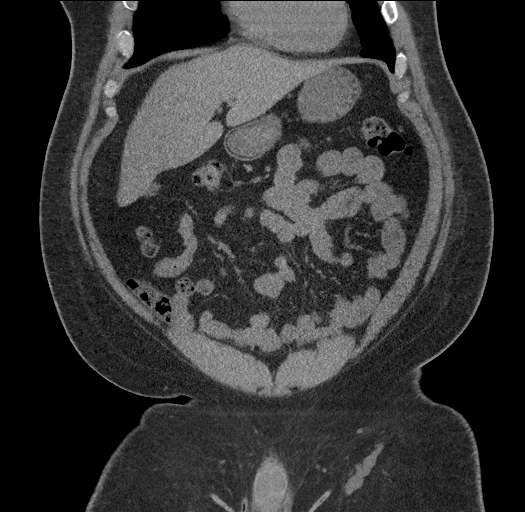
[im 65/147  soft-tissue]
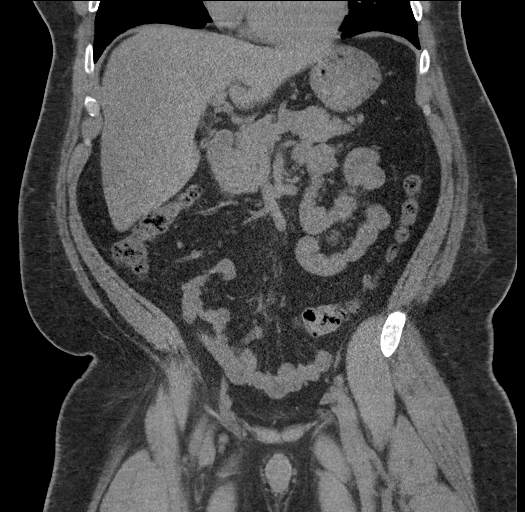
[im 82/147  soft-tissue]
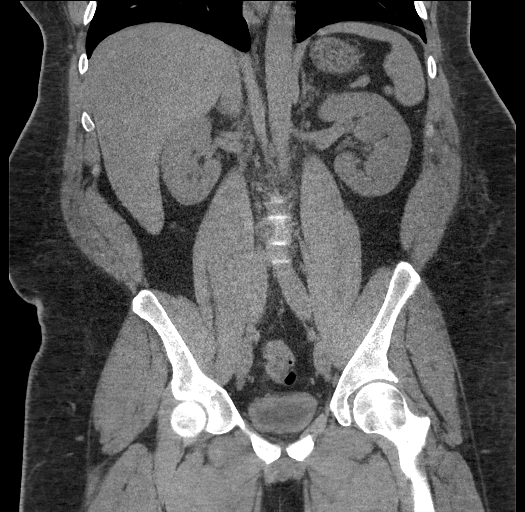

[17 of 46 positions shown; findings below may reference images not displayed]

FINDINGS: Lower chest: Lung bases are clear. No effusions. Heart is normal
size.

Hepatobiliary: No focal hepatic abnormality. Gallbladder
unremarkable.

Pancreas: No focal abnormality or ductal dilatation.

Spleen: No focal abnormality.  Normal size.

Adrenals/Urinary Tract: No adrenal abnormality. No focal renal
abnormality. No stones or hydronephrosis. Urinary bladder is
unremarkable.

Stomach/Bowel: Stomach, large and small bowel grossly unremarkable.
Normal appendix.

Vascular/Lymphatic: No evidence of aneurysm or adenopathy.

Reproductive: No visible focal abnormality.

Other: No free fluid or free air.

Musculoskeletal: No acute bony abnormality.
IMPRESSION: No acute findings in the abdomen or pelvis. No explanation for the
patient's flank pain.

## 2023-07-24 ENCOUNTER — Other Ambulatory Visit: Payer: Self-pay

## 2023-07-24 ENCOUNTER — Encounter (HOSPITAL_BASED_OUTPATIENT_CLINIC_OR_DEPARTMENT_OTHER): Payer: Self-pay | Admitting: Emergency Medicine

## 2023-07-24 DIAGNOSIS — R300 Dysuria: Secondary | ICD-10-CM | POA: Insufficient documentation

## 2023-07-24 LAB — URINALYSIS, ROUTINE W REFLEX MICROSCOPIC
Bilirubin Urine: NEGATIVE
Glucose, UA: NEGATIVE mg/dL
Hgb urine dipstick: NEGATIVE
Ketones, ur: NEGATIVE mg/dL
Leukocytes,Ua: NEGATIVE
Nitrite: NEGATIVE
Protein, ur: NEGATIVE mg/dL
Specific Gravity, Urine: >=1.03 (ref 1.005–1.030)
pH: 5.5 (ref 5.0–8.0)

## 2023-07-24 LAB — CBC
HCT: 46.7 % (ref 39.0–52.0)
Hemoglobin: 15.1 g/dL (ref 13.0–17.0)
MCH: 27.9 pg (ref 26.0–34.0)
MCHC: 32.3 g/dL (ref 30.0–36.0)
MCV: 86.3 fL (ref 80.0–100.0)
Platelets: 250 10*3/uL (ref 150–400)
RBC: 5.41 MIL/uL (ref 4.22–5.81)
RDW: 14.3 % (ref 11.5–15.5)
WBC: 7.4 10*3/uL (ref 4.0–10.5)
nRBC: 0 % (ref 0.0–0.2)

## 2023-07-24 NOTE — ED Triage Notes (Signed)
Dysuria, fatigue, muscle soreness since Friday. Pt also mentions urine looks dark. He states he has no concern for STI's since he has not been sexual active since February.

## 2023-07-25 ENCOUNTER — Other Ambulatory Visit (HOSPITAL_BASED_OUTPATIENT_CLINIC_OR_DEPARTMENT_OTHER): Payer: Self-pay

## 2023-07-25 ENCOUNTER — Emergency Department (HOSPITAL_BASED_OUTPATIENT_CLINIC_OR_DEPARTMENT_OTHER)
Admission: EM | Admit: 2023-07-25 | Discharge: 2023-07-25 | Disposition: A | Discharge status: Home / Self Care | Payer: Self-pay | Attending: Emergency Medicine | Admitting: Emergency Medicine

## 2023-07-25 DIAGNOSIS — R300 Dysuria: Secondary | ICD-10-CM

## 2023-07-25 LAB — BASIC METABOLIC PANEL
Anion gap: 12 (ref 5–15)
BUN: 19 mg/dL (ref 6–20)
CO2: 25 mmol/L (ref 22–32)
Calcium: 8.6 mg/dL — ABNORMAL LOW (ref 8.9–10.3)
Chloride: 100 mmol/L (ref 98–111)
Creatinine, Ser: 1.39 mg/dL — ABNORMAL HIGH (ref 0.61–1.24)
GFR, Estimated: >60 mL/min (ref >60)
Glucose, Bld: 94 mg/dL (ref 70–99)
Potassium: 3.9 mmol/L (ref 3.5–5.1)
Sodium: 137 mmol/L (ref 135–145)

## 2023-07-25 LAB — CK: Total CK: 176 U/L (ref 49–397)

## 2023-07-25 MED ORDER — CEPHALEXIN 250 MG PO CAPS
500.0000 mg | ORAL_CAPSULE | Freq: Once | ORAL | Status: AC
  Administered 2023-07-25: 500 mg via ORAL
  Filled 2023-07-25: qty 2

## 2023-07-25 MED ORDER — CEPHALEXIN 500 MG PO CAPS
500.0000 mg | ORAL_CAPSULE | Freq: Three times a day (TID) | ORAL | 0 refills | Status: AC
  Filled 2023-07-25: qty 21, 7d supply, fill #0

## 2023-07-25 NOTE — ED Provider Notes (Signed)
[place] [facility] Provider Note   CSN: [id number] Arrival date & time: 07/24/23  2302     History  Chief Complaint  Patient presents with   Dysuria    Eric House is a 37 y.o. male.  Patient is a 37 year old male with history of prior UTI.  Patient presents today with complaints of burning with urination.  This has been worsening over the past several days.  Symptoms feel similar to what he experienced with a prior UTI.  He denies being sexually active since February and highly doubt the possibility of an STD.  He also reports that his urine looked dark earlier today.  The history is provided by the patient.       Home Medications Prior to Admission medications   Not on File      Allergies    Patient has no known allergies.    Review of Systems   Review of Systems  All other systems reviewed and are negative.   Physical Exam Updated Vital Signs BP (!) 166/121   Pulse 89   Temp 98.2 F (36.8 C)   Resp 20   Ht 6' 2.5" (1.892 m)   Wt (!) 167.8 kg   SpO2 94%   BMI 46.87 kg/m  Physical Exam Vitals and nursing note reviewed.  Constitutional:      General: He is not in acute distress.    Appearance: He is well-developed. He is not diaphoretic.  HENT:     Head: Normocephalic and atraumatic.  Cardiovascular:     Rate and Rhythm: Normal rate and regular rhythm.     Heart sounds: No murmur heard.    No friction rub.  Pulmonary:     Effort: Pulmonary effort is normal. No respiratory distress.     Breath sounds: Normal breath sounds. No wheezing or rales.  Abdominal:     General: Bowel sounds are normal. There is no distension.     Palpations: Abdomen is soft.     Tenderness: There is no abdominal tenderness.  Musculoskeletal:        General: Normal range of motion.     Cervical back: Normal range of motion and neck supple.  Skin:    General: Skin is warm and dry.  Neurological:     Mental Status: He is alert and  oriented to person, place, and time.     Coordination: Coordination normal.     ED Results / Procedures / Treatments   Labs (all labs ordered are listed, but only abnormal results are displayed) Labs Reviewed  BASIC METABOLIC PANEL - Abnormal; Notable for the following components:      Result Value   Creatinine, Ser 1.39 (*)    Calcium 8.6 (*)    All other components within normal limits  URINALYSIS, ROUTINE W REFLEX MICROSCOPIC  CBC  CK    EKG None  Radiology No results found.  Procedures Procedures    Medications Ordered in ED Medications - No data to display  ED Course/ Medical Decision Making/ A&P  Patient presenting with complaints of dysuria and dark urine as described in the HPI.  Patient arrives with stable vital signs and physical examination which is unremarkable.  Laboratory studies obtained in triage including CBC, CMP, and total CK.  All studies are normal.  Urinalysis is clear.  Patient is convinced he has a urinary tract infection based on symptomatology.  I will prescribe him Keflex and see if this helps.  GC  and chlamydia also added to the urine and are pending.  Final Clinical Impression(s) / ED Diagnoses Final diagnoses:  None    Rx / DC Orders ED Discharge Orders     None         Geoffery Lyons, MD 07/25/23 [phone]

## 2023-07-25 NOTE — Discharge Instructions (Signed)
Begin taking Keflex as prescribed.  Follow-up with primary doctor if symptoms do not improve.  Days.

## 2023-07-26 LAB — GC/CHLAMYDIA PROBE AMP (~~LOC~~) NOT AT ARMC
Chlamydia: NEGATIVE
Comment: NEGATIVE
Comment: NORMAL
Neisseria Gonorrhea: NEGATIVE

## 2023-08-04 ENCOUNTER — Other Ambulatory Visit (HOSPITAL_BASED_OUTPATIENT_CLINIC_OR_DEPARTMENT_OTHER): Payer: Self-pay

## 2023-09-27 ENCOUNTER — Telehealth: Payer: Self-pay | Admitting: *Deleted

## 2023-09-27 ENCOUNTER — Emergency Department (HOSPITAL_BASED_OUTPATIENT_CLINIC_OR_DEPARTMENT_OTHER)
Admission: EM | Admit: 2023-09-27 | Discharge: 2023-09-27 | Disposition: A | Discharge status: Home / Self Care | Payer: Self-pay | Attending: Emergency Medicine | Admitting: Emergency Medicine

## 2023-09-27 ENCOUNTER — Encounter (HOSPITAL_BASED_OUTPATIENT_CLINIC_OR_DEPARTMENT_OTHER): Payer: Self-pay | Admitting: Emergency Medicine

## 2023-09-27 ENCOUNTER — Other Ambulatory Visit (HOSPITAL_BASED_OUTPATIENT_CLINIC_OR_DEPARTMENT_OTHER): Payer: Self-pay

## 2023-09-27 ENCOUNTER — Emergency Department (HOSPITAL_BASED_OUTPATIENT_CLINIC_OR_DEPARTMENT_OTHER): Payer: Self-pay

## 2023-09-27 ENCOUNTER — Other Ambulatory Visit: Payer: Self-pay

## 2023-09-27 DIAGNOSIS — F17210 Nicotine dependence, cigarettes, uncomplicated: Secondary | ICD-10-CM | POA: Insufficient documentation

## 2023-09-27 DIAGNOSIS — M79661 Pain in right lower leg: Secondary | ICD-10-CM | POA: Insufficient documentation

## 2023-09-27 DIAGNOSIS — M79604 Pain in right leg: Secondary | ICD-10-CM

## 2023-09-27 DIAGNOSIS — I10 Essential (primary) hypertension: Secondary | ICD-10-CM | POA: Insufficient documentation

## 2023-09-27 DIAGNOSIS — R2241 Localized swelling, mass and lump, right lower limb: Secondary | ICD-10-CM | POA: Insufficient documentation

## 2023-09-27 DIAGNOSIS — M79671 Pain in right foot: Secondary | ICD-10-CM | POA: Insufficient documentation

## 2023-09-27 MED ORDER — AMLODIPINE BESYLATE 5 MG PO TABS
5.0000 mg | ORAL_TABLET | Freq: Once | ORAL | Status: AC
  Administered 2023-09-27: 5 mg via ORAL
  Filled 2023-09-27: qty 1

## 2023-09-27 MED ORDER — AMLODIPINE BESYLATE 10 MG PO TABS
10.0000 mg | ORAL_TABLET | Freq: Every day | ORAL | 1 refills | Status: AC
  Filled 2023-09-27: qty 30, 30d supply, fill #0
  Filled 2023-12-22: qty 30, 30d supply, fill #1

## 2023-09-27 NOTE — ED Triage Notes (Addendum)
Pt reports swelling to his right ankle & lower leg. Denies injury to that area. Pt also reports pain to touch. Hypertensive in triage reports he has been recently. Denies BP meds.

## 2023-09-27 NOTE — Progress Notes (Signed)
  Care Coordination  Outreach Note  09/27/2023 Name: Eric House MRN: [id number] DOB: 05-Mar-1986   Care Coordination Outreach Attempts: An unsuccessful telephone outreach was attempted today to offer the patient information about available care coordination services.  Follow Up Plan:  Additional outreach attempts will be made to offer the patient care coordination information and services.   Encounter Outcome:  No Answer Gwenevere Ghazi  Care Coordination Care Guide  Direct Dial: [phone]
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
## 2023-09-27 NOTE — ED Notes (Addendum)
Patient given crackers and ginger ale per request, ok per EDP.  Family member at bedside.

## 2023-09-27 NOTE — ED Notes (Signed)
Patient to US

## 2023-09-27 NOTE — Discharge Instructions (Addendum)

## 2023-09-30 NOTE — Progress Notes (Unsigned)
  Care Coordination  Outreach Note  09/30/2023 Name: Eric House MRN: [id number] DOB: [birth year]   Care Coordination Outreach Attempts: A second unsuccessful outreach was attempted today to offer the patient with information about available care coordination services.  Follow Up Plan:  Additional outreach attempts will be made to offer the patient care coordination information and services.   Encounter Outcome:  No Answer  Gwenevere Ghazi  Care Coordination Care Guide  Direct Dial: [phone]

## 2023-10-04 NOTE — Progress Notes (Signed)
  Care Coordination  Outreach Note  10/04/2023 Name: Gevorg Utrera MRN: [id number] DOB: [birth year]   Care Coordination Outreach Attempts: A third unsuccessful outreach was attempted today to offer the patient with information about available care coordination services.  Follow Up Plan:  No further outreach attempts will be made at this time. We have been unable to contact the patient to offer or enroll patient in care coordination services  Encounter Outcome:  No Answer  Gwenevere Ghazi  Care Coordination Care Guide  Direct Dial: [phone]

## 2023-11-15 ENCOUNTER — Other Ambulatory Visit (HOSPITAL_BASED_OUTPATIENT_CLINIC_OR_DEPARTMENT_OTHER): Payer: Self-pay

## 2023-12-22 ENCOUNTER — Other Ambulatory Visit (HOSPITAL_BASED_OUTPATIENT_CLINIC_OR_DEPARTMENT_OTHER): Payer: Self-pay
